# Patient Record
Sex: Male | Born: 1995 | Race: Black or African American | Hispanic: No | Marital: Single | Smoking: Never smoker
Health system: Southern US, Community
[De-identification: ages and names within clinical notes are randomized; demographics above are authoritative.]

## PROBLEM LIST (undated history)

## (undated) DIAGNOSIS — S069X9A Unspecified intracranial injury with loss of consciousness of unspecified duration, initial encounter: Secondary | ICD-10-CM

## (undated) DIAGNOSIS — S069XAA Unspecified intracranial injury with loss of consciousness status unknown, initial encounter: Secondary | ICD-10-CM

---

## 2017-01-31 ENCOUNTER — Encounter (HOSPITAL_COMMUNITY): Payer: Self-pay | Admitting: *Deleted

## 2017-01-31 DIAGNOSIS — Z202 Contact with and (suspected) exposure to infections with a predominantly sexual mode of transmission: Secondary | ICD-10-CM | POA: Insufficient documentation

## 2017-01-31 DIAGNOSIS — F17210 Nicotine dependence, cigarettes, uncomplicated: Secondary | ICD-10-CM | POA: Insufficient documentation

## 2017-01-31 DIAGNOSIS — M545 Low back pain: Secondary | ICD-10-CM | POA: Insufficient documentation

## 2017-01-31 LAB — COMPREHENSIVE METABOLIC PANEL
ALBUMIN: 4.3 g/dL (ref 3.5–5.0)
ALK PHOS: 42 U/L (ref 38–126)
ALT: 11 U/L — AB (ref 17–63)
ANION GAP: 7 (ref 5–15)
AST: 18 U/L (ref 15–41)
BUN: 8 mg/dL (ref 6–20)
CHLORIDE: 104 mmol/L (ref 101–111)
CO2: 27 mmol/L (ref 22–32)
CREATININE: 1.07 mg/dL (ref 0.61–1.24)
Calcium: 9.2 mg/dL (ref 8.9–10.3)
GFR calc Af Amer: >60 mL/min (ref >60)
GFR calc non Af Amer: >60 mL/min (ref >60)
Glucose, Bld: 95 mg/dL (ref 65–99)
Potassium: 4 mmol/L (ref 3.5–5.1)
SODIUM: 138 mmol/L (ref 135–145)
Total Bilirubin: 0.8 mg/dL (ref 0.3–1.2)
Total Protein: 6.8 g/dL (ref 6.5–8.1)

## 2017-01-31 LAB — URINALYSIS, ROUTINE W REFLEX MICROSCOPIC
Bilirubin Urine: NEGATIVE
GLUCOSE, UA: NEGATIVE mg/dL
HGB URINE DIPSTICK: NEGATIVE
KETONES UR: NEGATIVE mg/dL
Leukocytes, UA: NEGATIVE
Nitrite: NEGATIVE
PROTEIN: NEGATIVE mg/dL
Specific Gravity, Urine: 1.023 (ref 1.005–1.030)
pH: 6 (ref 5.0–8.0)

## 2017-01-31 LAB — CBC
HCT: 39.2 % (ref 39.0–52.0)
Hemoglobin: 13.2 g/dL (ref 13.0–17.0)
MCH: 29.2 pg (ref 26.0–34.0)
MCHC: 33.7 g/dL (ref 30.0–36.0)
MCV: 86.7 fL (ref 78.0–100.0)
PLATELETS: 218 10*3/uL (ref 150–400)
RBC: 4.52 MIL/uL (ref 4.22–5.81)
RDW: 11.9 % (ref 11.5–15.5)
WBC: 5.3 10*3/uL (ref 4.0–10.5)

## 2017-01-31 NOTE — ED Triage Notes (Signed)
Pt c/o lower abdominal and back pain x 5 days. Pain is intermittent, denies urinary issues

## 2017-02-01 ENCOUNTER — Emergency Department (HOSPITAL_COMMUNITY)
Admission: EM | Admit: 2017-02-01 | Discharge: 2017-02-01 | Disposition: A | Discharge status: Home / Self Care | Payer: Self-pay | Attending: Emergency Medicine | Admitting: Emergency Medicine

## 2017-02-01 DIAGNOSIS — R1030 Lower abdominal pain, unspecified: Secondary | ICD-10-CM

## 2017-02-01 DIAGNOSIS — Z113 Encounter for screening for infections with a predominantly sexual mode of transmission: Secondary | ICD-10-CM

## 2017-02-01 LAB — RPR: RPR: NONREACTIVE

## 2017-02-01 MED ORDER — AZITHROMYCIN 250 MG PO TABS
1000.0000 mg | ORAL_TABLET | Freq: Once | ORAL | Status: AC
  Administered 2017-02-01: 1000 mg via ORAL
  Filled 2017-02-01: qty 4

## 2017-02-01 MED ORDER — CEFTRIAXONE SODIUM 250 MG IJ SOLR
250.0000 mg | Freq: Once | INTRAMUSCULAR | Status: AC
  Administered 2017-02-01: 250 mg via INTRAMUSCULAR
  Filled 2017-02-01: qty 250

## 2017-02-01 MED ORDER — STERILE WATER FOR INJECTION IJ SOLN
INTRAMUSCULAR | Status: AC
  Administered 2017-02-01: 1 mL
  Filled 2017-02-01: qty 10

## 2017-02-01 NOTE — Discharge Instructions (Signed)
°  Use a condom with every sexual encounter Follow up with your doctor in regards to today's visit.   Please return to the ER for worsening symptoms, high fevers or persistent vomiting.  You have been tested for HIV, syphilis, chlamydia and gonorrhea. These results will be available in approximately 3 days. You will be notified if they are positive.

## 2017-02-01 NOTE — ED Provider Notes (Signed)
[facility]-EMERGENCY DEPT Provider Note   CSN: [id number] Arrival date & time: 01/31/17  2229     History   Chief Complaint Chief Complaint  Patient presents with  . Abdominal Pain  . Back Pain    HPI Brett Moreno is a 21 y.o. male.  The history is provided by the patient and medical records. No language interpreter was used.  Abdominal Pain   Pertinent negatives include diarrhea, nausea, vomiting and constipation.  Back Pain   Associated symptoms include abdominal pain.   Brett Moreno is an otherwise healthy 21 y.o. male  who presents to the Emergency Department for STD check. Patient states that his significant other has tested positive for an infection, therefore he came to the emergency department to make sure that he also did not have this infection.Over the last 5 days he has had intermittent lower abdominal pain and low back cramping. No nausea, vomiting, penile discharge, rashes/lesions, fever, chills, chest pain, shortness of breath and no medications taken prior to arrival for his symptoms. Asymptomatic at present. He states that the symptoms will come and go throughout the last several days, never lasting more than 20-30 minutes.   History reviewed. No pertinent past medical history.  There are no active problems to display for this patient.   History reviewed. No pertinent surgical history.     Home Medications    Prior to Admission medications   Not on File    Family History No family history on file.  Social History Social History  Substance Use Topics  . Smoking status: Current Every Day Smoker  . Smokeless tobacco: Never Used  . Alcohol use Yes     Allergies   Patient has no known allergies.   Review of Systems Review of Systems  Gastrointestinal: Positive for abdominal pain. Negative for blood in stool, constipation, diarrhea, nausea and vomiting.  Musculoskeletal: Positive for back pain.  All other systems reviewed and are  negative.    Physical Exam Updated Vital Signs BP 114/64 (BP Location: Right Arm)   Pulse 61   Temp 97.8 F (36.6 C) (Oral)   Resp 16   SpO2 99%   Physical Exam  Constitutional: He is oriented to person, place, and time. He appears well-developed and well-nourished. No distress.  HENT:  Head: Normocephalic and atraumatic.  Cardiovascular: Normal rate, regular rhythm and normal heart sounds.   No murmur heard. Pulmonary/Chest: Effort normal and breath sounds normal. No respiratory distress.  Abdominal: Soft. He exhibits no distension. There is no tenderness.  No abdominal, flank or CVA tenderness.  Musculoskeletal: Normal range of motion.  No midline tenderness. Full ROM and 5/5 strength throughout.  Neurological: He is alert and oriented to person, place, and time.  Bilateral lower extremities neurovascularly intact.   Skin: Skin is warm and dry.  Nursing note and vitals reviewed.    ED Treatments / Results  Labs (all labs ordered are listed, but only abnormal results are displayed) Labs Reviewed  COMPREHENSIVE METABOLIC PANEL - Abnormal; Notable for the following:       Result Value   ALT 11 (*)    All other components within normal limits  CBC  URINALYSIS, ROUTINE W REFLEX MICROSCOPIC  RPR  HIV ANTIBODY (ROUTINE TESTING)  GC/CHLAMYDIA PROBE AMP ([place]) NOT AT [facility]    EKG  EKG Interpretation None       Radiology No results found.  Procedures Procedures (including critical care time)  Medications Ordered in ED Medications  cefTRIAXone (ROCEPHIN)  injection 250 mg (not administered)  azithromycin (ZITHROMAX) tablet 1,000 mg (not administered)     Initial Impression / Assessment and Plan / ED Course  I have reviewed the triage vital signs and the nursing notes.  Pertinent labs & imaging results that were available during my care of the patient were reviewed by me and considered in my medical decision making (see chart for details).    Brett Moreno is a 21 y.o. male who presents to ED requesting STD check. Has had intermittent lower abdominal pain and low back pain which is not present tonight. Benign abdominal exam. No midline tenderness or CVA tenderness. Full strength with no neuro deficits. No red flag back pain symptoms. Lab work and urine reassuring. HIV, RPR, GC obtained and patient aware that he will be notified if results are positive. Prophylactic Rocephin and Zithromax given. Evaluation does not show pathology that would require ongoing emergent intervention or inpatient treatment. Reasons to return to ER discussed and all questions answered.    Final Clinical Impressions(s) / ED Diagnoses   Final diagnoses:  Screen for STD (sexually transmitted disease)  Lower abdominal pain    New Prescriptions New Prescriptions   No medications on file     Ward, Chase Picket, PA-C 02/01/17 0350    Melene Plan, DO 02/01/17 0405

## 2017-02-02 LAB — GC/CHLAMYDIA PROBE AMP (~~LOC~~) NOT AT ARMC
CHLAMYDIA, DNA PROBE: NEGATIVE
NEISSERIA GONORRHEA: NEGATIVE

## 2017-02-11 LAB — HIV 1/2 AB DIFFERENTIATION
HIV 1 AB: NEGATIVE
HIV 2 AB: NEGATIVE
NOTE (HIV CONF MULTISPOT): NEGATIVE

## 2017-02-11 LAB — HIV ANTIBODY (ROUTINE TESTING W REFLEX): HIV Screen 4th Generation wRfx: REACTIVE — AB

## 2017-02-11 LAB — RNA QUALITATIVE: HIV 1 RNA Qualitative: UNDETERMINED

## 2017-05-13 ENCOUNTER — Other Ambulatory Visit: Payer: Self-pay

## 2017-05-13 ENCOUNTER — Emergency Department (HOSPITAL_COMMUNITY)
Admission: EM | Admit: 2017-05-13 | Discharge: 2017-05-13 | Disposition: A | Discharge status: Home / Self Care | Payer: Self-pay | Attending: Emergency Medicine | Admitting: Emergency Medicine

## 2017-05-13 ENCOUNTER — Encounter (HOSPITAL_COMMUNITY): Payer: Self-pay | Admitting: Emergency Medicine

## 2017-05-13 DIAGNOSIS — G894 Chronic pain syndrome: Secondary | ICD-10-CM | POA: Insufficient documentation

## 2017-05-13 DIAGNOSIS — F17210 Nicotine dependence, cigarettes, uncomplicated: Secondary | ICD-10-CM | POA: Insufficient documentation

## 2017-05-13 NOTE — ED Notes (Signed)
Patient refused to wait for discharge papers, vs, and to sign for discharge. Pt ambulatory without difficulty upon leaving.
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
## 2017-07-17 ENCOUNTER — Emergency Department (HOSPITAL_COMMUNITY)
Admission: EM | Admit: 2017-07-17 | Discharge: 2017-07-17 | Disposition: A | Discharge status: Home / Self Care | Payer: Self-pay | Attending: Emergency Medicine | Admitting: Emergency Medicine

## 2017-07-17 ENCOUNTER — Other Ambulatory Visit: Payer: Self-pay

## 2017-07-17 ENCOUNTER — Emergency Department (HOSPITAL_COMMUNITY): Payer: Self-pay

## 2017-07-17 ENCOUNTER — Encounter (HOSPITAL_COMMUNITY): Payer: Self-pay | Admitting: *Deleted

## 2017-07-17 DIAGNOSIS — S80812A Abrasion, left lower leg, initial encounter: Secondary | ICD-10-CM

## 2017-07-17 DIAGNOSIS — Y999 Unspecified external cause status: Secondary | ICD-10-CM | POA: Insufficient documentation

## 2017-07-17 DIAGNOSIS — Y9389 Activity, other specified: Secondary | ICD-10-CM | POA: Insufficient documentation

## 2017-07-17 DIAGNOSIS — S8012XA Contusion of left lower leg, initial encounter: Secondary | ICD-10-CM

## 2017-07-17 DIAGNOSIS — F17200 Nicotine dependence, unspecified, uncomplicated: Secondary | ICD-10-CM | POA: Insufficient documentation

## 2017-07-17 DIAGNOSIS — Y92410 Unspecified street and highway as the place of occurrence of the external cause: Secondary | ICD-10-CM | POA: Insufficient documentation
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
## 2017-07-17 NOTE — Discharge Instructions (Signed)
X-rays are negative.  Pain is likely from a contusion or your calf and leg. Take tylenol and ibuprofen for pain. Ice. Elevate. Return to the emergency department if your calf feels tight, hard, or changes colors, you have numbness or tingling to your leg.
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
## 2017-07-23 ENCOUNTER — Encounter (HOSPITAL_COMMUNITY): Payer: Self-pay | Admitting: Emergency Medicine

## 2017-07-23 ENCOUNTER — Other Ambulatory Visit: Payer: Self-pay

## 2017-07-23 ENCOUNTER — Emergency Department (HOSPITAL_COMMUNITY): Payer: Self-pay

## 2017-07-23 ENCOUNTER — Emergency Department (HOSPITAL_COMMUNITY)
Admission: EM | Admit: 2017-07-23 | Discharge: 2017-07-24 | Disposition: A | Discharge status: Home / Self Care | Payer: Self-pay | Attending: Emergency Medicine | Admitting: Emergency Medicine

## 2017-07-23 DIAGNOSIS — M79604 Pain in right leg: Secondary | ICD-10-CM

## 2017-07-23 DIAGNOSIS — F17200 Nicotine dependence, unspecified, uncomplicated: Secondary | ICD-10-CM | POA: Insufficient documentation

## 2017-07-23 DIAGNOSIS — Z202 Contact with and (suspected) exposure to infections with a predominantly sexual mode of transmission: Secondary | ICD-10-CM

## 2017-07-23 DIAGNOSIS — M79605 Pain in left leg: Secondary | ICD-10-CM | POA: Insufficient documentation

## 2017-07-23 MED ORDER — ACETAMINOPHEN 325 MG PO TABS
650.0000 mg | ORAL_TABLET | Freq: Once | ORAL | Status: AC
  Administered 2017-07-23: 650 mg via ORAL
  Filled 2017-07-23: qty 2
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
## 2017-07-23 NOTE — Discharge Instructions (Signed)
You can take Tylenol or Ibuprofen as directed for pain. You can alternate Tylenol and Ibuprofen every 4 hours. If you take Tylenol at 1pm, then you can take Ibuprofen at 5pm. Then you can take Tylenol again at 9pm.   Follow the RICE (Rest, Ice, Compression, Elevation) protocol as directed.   As we discussed, you can follow-up with the health department regarding an STD check.  Return emergency department for swelling, redness, warmth of your legs, worsening pain, inability to walk or any other worsening or concerning symptoms.
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
## 2017-08-11 ENCOUNTER — Other Ambulatory Visit: Payer: Self-pay

## 2017-08-11 ENCOUNTER — Emergency Department (HOSPITAL_COMMUNITY)
Admission: EM | Admit: 2017-08-11 | Discharge: 2017-08-11 | Disposition: A | Discharge status: Home / Self Care | Payer: Self-pay | Attending: Physician Assistant | Admitting: Physician Assistant

## 2017-08-11 ENCOUNTER — Encounter (HOSPITAL_COMMUNITY): Payer: Self-pay | Admitting: Emergency Medicine

## 2017-08-11 DIAGNOSIS — Z202 Contact with and (suspected) exposure to infections with a predominantly sexual mode of transmission: Secondary | ICD-10-CM | POA: Insufficient documentation

## 2017-08-11 DIAGNOSIS — R300 Dysuria: Secondary | ICD-10-CM | POA: Insufficient documentation

## 2017-08-11 DIAGNOSIS — F17200 Nicotine dependence, unspecified, uncomplicated: Secondary | ICD-10-CM | POA: Insufficient documentation

## 2017-08-11 LAB — URINALYSIS, ROUTINE W REFLEX MICROSCOPIC
Bilirubin Urine: NEGATIVE
Glucose, UA: NEGATIVE mg/dL
Hgb urine dipstick: NEGATIVE
Ketones, ur: NEGATIVE mg/dL
LEUKOCYTES UA: NEGATIVE
NITRITE: NEGATIVE
PH: 7 (ref 5.0–8.0)
Protein, ur: NEGATIVE mg/dL
SPECIFIC GRAVITY, URINE: 1.025 (ref 1.005–1.030)

## 2017-08-11 MED ORDER — LIDOCAINE HCL (PF) 1 % IJ SOLN
INTRAMUSCULAR | Status: AC
  Administered 2017-08-11: 5 mL
  Filled 2017-08-11: qty 5

## 2017-08-11 MED ORDER — CEFTRIAXONE SODIUM 250 MG IJ SOLR
250.0000 mg | Freq: Once | INTRAMUSCULAR | Status: AC
  Administered 2017-08-11: 250 mg via INTRAMUSCULAR
  Filled 2017-08-11: qty 250

## 2017-08-11 MED ORDER — AZITHROMYCIN 250 MG PO TABS
1000.0000 mg | ORAL_TABLET | Freq: Once | ORAL | Status: AC
  Administered 2017-08-11: 1000 mg via ORAL
  Filled 2017-08-11: qty 4

## 2017-08-11 NOTE — ED Triage Notes (Signed)
Reports he needs STD screening for "recreational purposes" Denies symptoms at this time.

## 2017-08-11 NOTE — ED Triage Notes (Signed)
Pt now reporting scrotal pain.

## 2017-08-11 NOTE — ED Provider Notes (Signed)
[facility]   CSN: [id number] Arrival date & time: 08/11/17  1134     History   Chief Complaint No chief complaint on file.   HPI  Brett Moreno is a 22 y.o. Male, presents to the ED for evaluation of dysuria.  Patient reports he intermittently has discomfort when urinating for the past few days, patient denies any penile discharge, he reports intermittently having some scrotal discomfort while urinating which always resolves after urination.  Patient denies any testicular pain or swelling.  Patient denies any genital bumps or lesions.  No abdominal pain no rectal pain or pain with defecation.  No fevers or chills no nausea, vomiting or diarrhea.  Patient is unsure if he is been exposed to any STDs, was seen prior and was asymptomatic was told to follow-up with the health department but has not done this.      History reviewed. No pertinent past medical history.  There are no active problems to display for this patient.   History reviewed. No pertinent surgical history.     Home Medications    Prior to Admission medications   Not on File    Family History History reviewed. No pertinent family history.  Social History Social History   Tobacco Use  . Smoking status: Current Every Day Smoker  . Smokeless tobacco: Never Used  Substance Use Topics  . Alcohol use: Yes  . Drug use: No     Allergies   Patient has no known allergies.   Review of Systems Review of Systems  Constitutional: Negative for chills and fever.  Gastrointestinal: Negative for abdominal pain, nausea and vomiting.  Genitourinary: Positive for dysuria. Negative for discharge, flank pain, frequency, genital sores, hematuria, penile pain, penile swelling, scrotal swelling and testicular pain.  Skin: Negative for rash.     Physical Exam Updated Vital Signs BP 121/63 (BP Location: Left Arm)   Pulse 75   Temp 98.1 F (36.7 C) (Oral)    Resp 16   SpO2 100%   Physical Exam  Constitutional: He appears well-developed and well-nourished. No distress.  HENT:  Head: Normocephalic and atraumatic.  Eyes: Right eye exhibits no discharge. Left eye exhibits no discharge.  Pulmonary/Chest: Effort normal. No respiratory distress.  Abdominal: Soft. Bowel sounds are normal. He exhibits no distension and no mass. There is no tenderness. There is no guarding.  Genitourinary:  Genitourinary Comments: Chaperone present during genital exam No external lesions present No obvious discharge present or evidence of urethritis No scrotal erythema, swelling or tenderness, no testicular tenderness  Neurological: He is alert. Coordination normal.  Skin: Skin is warm and dry. Capillary refill takes less than 2 seconds. He is not diaphoretic.  Psychiatric: He has a normal mood and affect. His behavior is normal.  Nursing note and vitals reviewed.    ED Treatments / Results  Labs (all labs ordered are listed, but only abnormal results are displayed) Labs Reviewed  URINALYSIS, ROUTINE W REFLEX MICROSCOPIC - Abnormal; Notable for the following components:      Result Value   APPearance CLOUDY (*)    All other components within normal limits  HIV ANTIBODY (ROUTINE TESTING)  RPR  GC[facility]CHLAMYDIA [facility] AMP [facility]    EKG  EKG Interpretation None       Radiology No results found.  Procedures Procedures (including critical care time)  Medications Ordered in ED Medications  cefTRIAXone (ROCEPHIN) injection 250 mg (not administered)  azithromycin (ZITHROMAX)  tablet 1,000 mg (not administered)  lidocaine (PF) (XYLOCAINE) 1 % injection (not administered)     Initial Impression / Assessment and Plan / ED Course  I have reviewed the triage vital signs and the nursing notes.  Pertinent labs & imaging results that were available during my care of the patient were reviewed by me and considered in my medical decision making  (see chart for details).  Patient is afebrile without abdominal tenderness, abdominal pain or painful bowel movements to indicate prostatitis.  No tenderness to palpation of the testes or epididymis to suggest orchitis or epididymitis.  STD cultures obtained including HIV, syphilis, gonorrhea and chlamydia.  UA shows no evidence of trichomonas, UA without signs of infection. Patient to be discharged with instructions to follow up with PCP. Discussed importance of using protection when sexually active. Pt understands that they have GC/Chlamydia cultures pending and that they will need to inform all sexual partners if results return positive. Patient has been treated prophylactically with azithromycin and Rocephin.    Final Clinical Impressions(s) / ED Diagnoses   Final diagnoses:  Dysuria  Possible exposure to STD    ED Discharge Orders    None       Legrand Rams 08/11/17 1535    Abelino Derrick, MD 08/14/17 2008

## 2017-08-11 NOTE — Discharge Instructions (Signed)
You were prophylactically treated for gonorrhea and chlamydia today, you have STD testing pending and will be called in 2-3 days if any of these results are positive.  Please ensure you are using condoms and notify any partners of any positive results.  In the future please follow-up at the health department for STD testing.

## 2017-08-11 NOTE — ED Notes (Signed)
Pt understands D/C instructions.

## 2017-08-12 LAB — RPR: RPR: NONREACTIVE

## 2017-08-12 LAB — GC/CHLAMYDIA PROBE AMP (~~LOC~~) NOT AT ARMC
CHLAMYDIA, DNA PROBE: NEGATIVE
Neisseria Gonorrhea: NEGATIVE

## 2017-08-12 LAB — HIV ANTIBODY (ROUTINE TESTING W REFLEX): HIV SCREEN 4TH GENERATION: NONREACTIVE

## 2017-09-10 ENCOUNTER — Other Ambulatory Visit: Payer: Self-pay

## 2017-09-10 DIAGNOSIS — R51 Headache: Secondary | ICD-10-CM | POA: Diagnosis present

## 2017-09-10 DIAGNOSIS — Y998 Other external cause status: Secondary | ICD-10-CM | POA: Diagnosis not present

## 2017-09-10 DIAGNOSIS — Y92410 Unspecified street and highway as the place of occurrence of the external cause: Secondary | ICD-10-CM | POA: Diagnosis not present

## 2017-09-10 DIAGNOSIS — S0240DA Maxillary fracture, left side, initial encounter for closed fracture: Secondary | ICD-10-CM | POA: Insufficient documentation

## 2017-09-10 DIAGNOSIS — S02402A Zygomatic fracture, unspecified, initial encounter for closed fracture: Secondary | ICD-10-CM | POA: Insufficient documentation

## 2017-09-10 DIAGNOSIS — K7689 Other specified diseases of liver: Secondary | ICD-10-CM | POA: Diagnosis not present

## 2017-09-10 DIAGNOSIS — Y9355 Activity, bike riding: Secondary | ICD-10-CM | POA: Insufficient documentation

## 2017-09-10 DIAGNOSIS — S0232XA Fracture of orbital floor, left side, initial encounter for closed fracture: Secondary | ICD-10-CM | POA: Diagnosis not present

## 2017-09-10 DIAGNOSIS — F17200 Nicotine dependence, unspecified, uncomplicated: Secondary | ICD-10-CM | POA: Diagnosis not present

## 2017-09-10 DIAGNOSIS — R079 Chest pain, unspecified: Secondary | ICD-10-CM | POA: Insufficient documentation

## 2017-09-10 DIAGNOSIS — M549 Dorsalgia, unspecified: Secondary | ICD-10-CM | POA: Diagnosis not present

## 2017-09-10 DIAGNOSIS — Z23 Encounter for immunization: Secondary | ICD-10-CM | POA: Insufficient documentation
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
## 2017-09-11 ENCOUNTER — Emergency Department (HOSPITAL_COMMUNITY): Payer: No Typology Code available for payment source

## 2017-09-11 ENCOUNTER — Emergency Department (HOSPITAL_COMMUNITY)
Admission: EM | Admit: 2017-09-11 | Discharge: 2017-09-11 | Disposition: A | Discharge status: Home / Self Care | Payer: No Typology Code available for payment source | Attending: Emergency Medicine | Admitting: Emergency Medicine

## 2017-09-11 DIAGNOSIS — S0292XA Unspecified fracture of facial bones, initial encounter for closed fracture: Secondary | ICD-10-CM

## 2017-09-11 DIAGNOSIS — S0285XA Fracture of orbit, unspecified, initial encounter for closed fracture: Secondary | ICD-10-CM

## 2017-09-11 DIAGNOSIS — T07XXXA Unspecified multiple injuries, initial encounter: Secondary | ICD-10-CM

## 2017-09-11 LAB — COMPREHENSIVE METABOLIC PANEL
ALK PHOS: 47 U/L (ref 38–126)
ALT: 18 U/L (ref 17–63)
AST: 25 U/L (ref 15–41)
Albumin: 4.7 g/dL (ref 3.5–5.0)
Anion gap: 13 (ref 5–15)
BUN: 13 mg/dL (ref 6–20)
CO2: 19 mmol/L — ABNORMAL LOW (ref 22–32)
CREATININE: 1.01 mg/dL (ref 0.61–1.24)
Calcium: 9.3 mg/dL (ref 8.9–10.3)
Chloride: 102 mmol/L (ref 101–111)
GFR calc Af Amer: >60 mL/min (ref >60)
Glucose, Bld: 90 mg/dL (ref 65–99)
Potassium: 4 mmol/L (ref 3.5–5.1)
Sodium: 134 mmol/L — ABNORMAL LOW (ref 135–145)
Total Bilirubin: 1.2 mg/dL (ref 0.3–1.2)
Total Protein: 7.2 g/dL (ref 6.5–8.1)

## 2017-09-11 LAB — ETHANOL: Alcohol, Ethyl (B): 54 mg/dL — ABNORMAL HIGH (ref <10)

## 2017-09-11 LAB — SAMPLE TO BLOOD BANK

## 2017-09-11 LAB — CBC
HCT: 41.7 % (ref 39.0–52.0)
Hemoglobin: 14.2 g/dL (ref 13.0–17.0)
MCH: 30.2 pg (ref 26.0–34.0)
MCHC: 34.1 g/dL (ref 30.0–36.0)
MCV: 88.7 fL (ref 78.0–100.0)
PLATELETS: 244 10*3/uL (ref 150–400)
RBC: 4.7 MIL/uL (ref 4.22–5.81)
RDW: 11.8 % (ref 11.5–15.5)
WBC: 19.6 10*3/uL — AB (ref 4.0–10.5)

## 2017-09-11 LAB — I-STAT CG4 LACTIC ACID, ED: Lactic Acid, Venous: 2.96 mmol/L (ref 0.5–1.9)

## 2017-09-11 LAB — I-STAT CHEM 8, ED
BUN: 14 mg/dL (ref 6–20)
CHLORIDE: 104 mmol/L (ref 101–111)
CREATININE: 1 mg/dL (ref 0.61–1.24)
Calcium, Ion: 1.08 mmol/L — ABNORMAL LOW (ref 1.15–1.40)
Glucose, Bld: 84 mg/dL (ref 65–99)
HEMATOCRIT: 45 % (ref 39.0–52.0)
HEMOGLOBIN: 15.3 g/dL (ref 13.0–17.0)
POTASSIUM: 4.1 mmol/L (ref 3.5–5.1)
Sodium: 138 mmol/L (ref 135–145)
TCO2: 22 mmol/L (ref 22–32)

## 2017-09-11 MED ORDER — BACITRACIN ZINC 500 UNIT/GM EX OINT: 1.0000 "application " | TOPICAL_OINTMENT | Freq: Two times a day (BID) | CUTANEOUS | 0 refills | Status: DC

## 2017-09-11 MED ORDER — CEFAZOLIN SODIUM-DEXTROSE 1-4 GM/50ML-% IV SOLN
1.0000 g | Freq: Once | INTRAVENOUS | Status: AC
  Administered 2017-09-11: 1 g via INTRAVENOUS
  Filled 2017-09-11 (×2): qty 50

## 2017-09-11 MED ORDER — TETANUS-DIPHTH-ACELL PERTUSSIS 5-2.5-18.5 LF-MCG/0.5 IM SUSP
0.5000 mL | Freq: Once | INTRAMUSCULAR | Status: AC
  Administered 2017-09-11: 0.5 mL via INTRAMUSCULAR
  Filled 2017-09-11: qty 0.5

## 2017-09-11 MED ORDER — BACITRACIN ZINC 500 UNIT/GM EX OINT
TOPICAL_OINTMENT | Freq: Two times a day (BID) | CUTANEOUS | Status: DC
  Filled 2017-09-11: qty 0.9

## 2017-09-11 MED ORDER — TETANUS-DIPHTH-ACELL PERTUSSIS 5-2.5-18.5 LF-MCG/0.5 IM SUSP: 0.5000 mL | Freq: Once | INTRAMUSCULAR | Status: DC

## 2017-09-11 MED ORDER — NAPROXEN 500 MG PO TABS: 500.0000 mg | ORAL_TABLET | Freq: Two times a day (BID) | ORAL | 0 refills | Status: DC

## 2017-09-11 MED ORDER — IOPAMIDOL (ISOVUE-300) INJECTION 61%
INTRAVENOUS | Status: AC
  Administered 2017-09-11: 100 mL
  Filled 2017-09-11: qty 100

## 2017-09-11 MED ORDER — HYDROCODONE-ACETAMINOPHEN 5-325 MG PO TABS: 1.0000 | ORAL_TABLET | Freq: Four times a day (QID) | ORAL | 0 refills | Status: DC | PRN

## 2017-09-11 MED ORDER — MORPHINE SULFATE (PF) 4 MG/ML IV SOLN
6.0000 mg | INTRAVENOUS | Status: DC | PRN
  Administered 2017-09-11: 6 mg via INTRAVENOUS
  Filled 2017-09-11: qty 2
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
## 2017-09-11 NOTE — ED Notes (Addendum)
Attempted to help Pt into gown, Pt states " I can get it on on my own".

## 2017-09-11 NOTE — ED Notes (Signed)
Pt on c- collar.Quarter size Abrasions on right upper lip, right hand and left palm. Bleeding controlled. Pupils equal and reactive to light. Pt complaint of pain to neck and face.

## 2017-09-11 NOTE — ED Notes (Addendum)
MD at bedside reviewing imaging results. C-collar removed by MD.
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
## 2018-08-15 ENCOUNTER — Other Ambulatory Visit: Payer: Self-pay

## 2018-08-15 ENCOUNTER — Emergency Department (HOSPITAL_COMMUNITY)
Admission: EM | Admit: 2018-08-15 | Discharge: 2018-08-15 | Disposition: A | Discharge status: Home / Self Care | Payer: Self-pay | Attending: Emergency Medicine | Admitting: Emergency Medicine

## 2018-08-15 ENCOUNTER — Encounter (HOSPITAL_COMMUNITY): Payer: Self-pay | Admitting: Emergency Medicine

## 2018-08-15 DIAGNOSIS — F17200 Nicotine dependence, unspecified, uncomplicated: Secondary | ICD-10-CM | POA: Insufficient documentation

## 2018-08-15 DIAGNOSIS — Z79899 Other long term (current) drug therapy: Secondary | ICD-10-CM | POA: Insufficient documentation

## 2018-08-15 DIAGNOSIS — Z202 Contact with and (suspected) exposure to infections with a predominantly sexual mode of transmission: Secondary | ICD-10-CM | POA: Insufficient documentation

## 2018-08-15 DIAGNOSIS — R21 Rash and other nonspecific skin eruption: Secondary | ICD-10-CM | POA: Insufficient documentation

## 2018-08-15 DIAGNOSIS — J069 Acute upper respiratory infection, unspecified: Secondary | ICD-10-CM | POA: Insufficient documentation

## 2018-08-15 DIAGNOSIS — Z711 Person with feared health complaint in whom no diagnosis is made: Secondary | ICD-10-CM

## 2018-08-15 LAB — URINALYSIS, ROUTINE W REFLEX MICROSCOPIC
Bilirubin Urine: NEGATIVE
Glucose, UA: NEGATIVE mg/dL
Hgb urine dipstick: NEGATIVE
Ketones, ur: 5 mg/dL — AB
Leukocytes,Ua: NEGATIVE
Nitrite: NEGATIVE
PROTEIN: NEGATIVE mg/dL
Specific Gravity, Urine: 1.018 (ref 1.005–1.030)
pH: 6 (ref 5.0–8.0)

## 2018-08-15 NOTE — ED Provider Notes (Signed)
[facility] EMERGENCY DEPARTMENT Provider Note   CSN: [id number] Arrival date & time: 08/15/18  1345    History   Chief Complaint Chief Complaint  Patient presents with  . Exposure to STD    HPI Brett Moreno is a 23 y.o. male who presents with URI symptoms and request for STD check.  The patient states that recently he has had chills and nasal congestion.  He states he is sexually active and last time was about a month ago.  He has been having some suprapubic pressure when he pees and is unsure if he has an STD or not.  He denies fever, runny nose, sore throat, chest pain, shortness of breath, headache, coughing, abdominal pain, nausea, vomiting, diarrhea, dysuria, penile discharge, testicular pain.  He has had several bumps on his finger, chin and genital area. The area is not itchy or painful.     HPI  History reviewed. No pertinent past medical history.  There are no active problems to display for this patient.   History reviewed. No pertinent surgical history.      Home Medications    Prior to Admission medications   Medication Sig Start Date End Date Taking? Authorizing Provider  bacitracin ointment Apply 1 application topically 2 (two) times daily. 09/11/17   Derwood Kaplan, MD  HYDROcodone-acetaminophen (NORCO/VICODIN) 5-325 MG tablet Take 1 tablet by mouth every 6 (six) hours as needed. 09/11/17   Derwood Kaplan, MD  naproxen (NAPROSYN) 500 MG tablet Take 1 tablet (500 mg total) by mouth 2 (two) times daily. 09/11/17   Derwood Kaplan, MD    Family History No family history on file.  Social History Social History   Tobacco Use  . Smoking status: Current Every Day Smoker  . Smokeless tobacco: Never Used  Substance Use Topics  . Alcohol use: Yes  . Drug use: Yes    Types: Marijuana     Allergies   Patient has no known allergies.   Review of Systems Review of Systems  Constitutional: Positive for chills. Negative for fever.    HENT: Positive for congestion.   Genitourinary: Negative for difficulty urinating, discharge, dysuria, flank pain, penile pain, penile swelling, scrotal swelling and testicular pain.  Skin: Positive for rash.     Physical Exam Updated Vital Signs BP 138/76 (BP Location: Right Arm)   Pulse 97   Temp 98.7 F (37.1 C) (Oral)   Resp 16   SpO2 98%   Physical Exam Vitals signs and nursing note reviewed.  Constitutional:      General: He is not in acute distress.    Appearance: Normal appearance. He is well-developed.     Comments: Calm and cooperative  HENT:     Head: Normocephalic and atraumatic.     Right Ear: Tympanic membrane normal.     Left Ear: Tympanic membrane normal.     Nose: Mucosal edema present.     Mouth/Throat:     Lips: Pink.     Mouth: Mucous membranes are moist.     Pharynx: Oropharynx is clear.  Eyes:     General: No scleral icterus.       Right eye: No discharge.        Left eye: No discharge.     Conjunctiva/sclera: Conjunctivae normal.     Pupils: Pupils are equal, round, and reactive to light.  Neck:     Musculoskeletal: Normal range of motion.  Cardiovascular:     Rate and Rhythm: Normal rate and  regular rhythm.  Pulmonary:     Effort: Pulmonary effort is normal. No respiratory distress.     Breath sounds: Normal breath sounds.  Abdominal:     General: There is no distension.     Palpations: Abdomen is soft.  Genitourinary:    Comments: No inguinal lymphadenopathy or inguinal hernia noted. Normal circumcised penis free of lesions or rash. Ingrown hair over pubic mons. Testicles are nontender with normal lie. Normal scrotal appearance. No obvious discharge noted. Chaperone present during exam.  Skin:    General: Skin is warm and dry.  Neurological:     Mental Status: He is alert and oriented to person, place, and time.  Psychiatric:        Behavior: Behavior normal.      ED Treatments / Results  Labs (all labs ordered are listed, but  only abnormal results are displayed) Labs Reviewed  URINALYSIS, ROUTINE W REFLEX MICROSCOPIC - Abnormal; Notable for the following components:      Result Value   Ketones, ur 5 (*)    All other components within normal limits  GC/CHLAMYDIA PROBE AMP ([place]) NOT AT [facility]    EKG None  Radiology No results found.  Procedures Procedures (including critical care time)  Medications Ordered in ED Medications - No data to display   Initial Impression / Assessment and Plan / ED Course  I have reviewed the triage vital signs and the nursing notes.  Pertinent labs & imaging results that were available during my care of the patient were reviewed by me and considered in my medical decision making (see chart for details).  24 year old male presents with STD check and cold symptoms. Vitals are normal. Exam is unremarkable. UA is essentially normal. G&C is sent. He was told to go to the health dept for future std check  Final Clinical Impressions(s) / ED Diagnoses   Final diagnoses:  Concern about STD in male without diagnosis  Upper respiratory tract infection, unspecified type    ED Discharge Orders    None       Bethel Born, PA-C 08/16/18 2263    Geoffery Lyons, MD 08/16/18 2329

## 2018-08-15 NOTE — Discharge Instructions (Signed)
Drink plenty of fluids Take Tylenol or Ibuprofen for pain or fever

## 2018-08-15 NOTE — ED Triage Notes (Addendum)
States is here for his monthly STD check states  Has  Had had dark urine , feels pressure  Denies d/c from penis had chills last night but they went away, feels pressure when  He pees

## 2018-08-16 LAB — GC/CHLAMYDIA PROBE AMP (~~LOC~~) NOT AT ARMC
Chlamydia: NEGATIVE
Neisseria Gonorrhea: NEGATIVE

## 2018-10-28 ENCOUNTER — Emergency Department (HOSPITAL_COMMUNITY)
Admission: EM | Admit: 2018-10-28 | Discharge: 2018-10-29 | Payer: Self-pay | Attending: Emergency Medicine | Admitting: Emergency Medicine

## 2018-10-28 ENCOUNTER — Emergency Department (HOSPITAL_COMMUNITY): Payer: Self-pay

## 2018-10-28 ENCOUNTER — Encounter (HOSPITAL_COMMUNITY): Payer: Self-pay | Admitting: *Deleted

## 2018-10-28 ENCOUNTER — Other Ambulatory Visit: Payer: Self-pay

## 2018-10-28 DIAGNOSIS — S80811A Abrasion, right lower leg, initial encounter: Secondary | ICD-10-CM | POA: Insufficient documentation

## 2018-10-28 DIAGNOSIS — S50811A Abrasion of right forearm, initial encounter: Secondary | ICD-10-CM | POA: Insufficient documentation

## 2018-10-28 DIAGNOSIS — T07XXXA Unspecified multiple injuries, initial encounter: Secondary | ICD-10-CM

## 2018-10-28 DIAGNOSIS — R071 Chest pain on breathing: Secondary | ICD-10-CM | POA: Insufficient documentation

## 2018-10-28 DIAGNOSIS — F17200 Nicotine dependence, unspecified, uncomplicated: Secondary | ICD-10-CM | POA: Insufficient documentation

## 2018-10-28 DIAGNOSIS — R1012 Left upper quadrant pain: Secondary | ICD-10-CM | POA: Insufficient documentation

## 2018-10-28 DIAGNOSIS — Y999 Unspecified external cause status: Secondary | ICD-10-CM | POA: Insufficient documentation

## 2018-10-28 DIAGNOSIS — Y929 Unspecified place or not applicable: Secondary | ICD-10-CM | POA: Insufficient documentation

## 2018-10-28 DIAGNOSIS — Y939 Activity, unspecified: Secondary | ICD-10-CM | POA: Insufficient documentation

## 2018-10-28 DIAGNOSIS — R51 Headache: Secondary | ICD-10-CM | POA: Insufficient documentation

## 2018-10-28 DIAGNOSIS — R0789 Other chest pain: Secondary | ICD-10-CM | POA: Insufficient documentation

## 2018-10-28 DIAGNOSIS — M25561 Pain in right knee: Secondary | ICD-10-CM | POA: Insufficient documentation

## 2018-10-28 DIAGNOSIS — M542 Cervicalgia: Secondary | ICD-10-CM | POA: Insufficient documentation

## 2018-10-28 LAB — I-STAT CHEM 8, ED
BUN: 20 mg/dL (ref 6–20)
Calcium, Ion: 1.21 mmol/L (ref 1.15–1.40)
Chloride: 101 mmol/L (ref 98–111)
Creatinine, Ser: 1.2 mg/dL (ref 0.61–1.24)
Glucose, Bld: 87 mg/dL (ref 70–99)
HCT: 48 % (ref 39.0–52.0)
Hemoglobin: 16.3 g/dL (ref 13.0–17.0)
Potassium: 3.8 mmol/L (ref 3.5–5.1)
Sodium: 139 mmol/L (ref 135–145)
TCO2: 30 mmol/L (ref 22–32)

## 2018-10-28 LAB — URINALYSIS, ROUTINE W REFLEX MICROSCOPIC
Bacteria, UA: NONE SEEN
Bilirubin Urine: NEGATIVE
Glucose, UA: NEGATIVE mg/dL
Hgb urine dipstick: NEGATIVE
Ketones, ur: NEGATIVE mg/dL
Leukocytes,Ua: NEGATIVE
Nitrite: NEGATIVE
Protein, ur: 30 mg/dL — AB
Specific Gravity, Urine: 1.034 — ABNORMAL HIGH (ref 1.005–1.030)
pH: 5 (ref 5.0–8.0)

## 2018-10-28 LAB — COMPREHENSIVE METABOLIC PANEL
ALT: 18 U/L (ref 0–44)
AST: 37 U/L (ref 15–41)
Albumin: 4.8 g/dL (ref 3.5–5.0)
Alkaline Phosphatase: 43 U/L (ref 38–126)
Anion gap: 11 (ref 5–15)
BUN: 16 mg/dL (ref 6–20)
CO2: 28 mmol/L (ref 22–32)
Calcium: 10 mg/dL (ref 8.9–10.3)
Chloride: 101 mmol/L (ref 98–111)
Creatinine, Ser: 1.25 mg/dL — ABNORMAL HIGH (ref 0.61–1.24)
GFR calc Af Amer: >60 mL/min (ref >60)
GFR calc non Af Amer: >60 mL/min (ref >60)
Glucose, Bld: 89 mg/dL (ref 70–99)
Potassium: 3.7 mmol/L (ref 3.5–5.1)
Sodium: 140 mmol/L (ref 135–145)
Total Bilirubin: 1.8 mg/dL — ABNORMAL HIGH (ref 0.3–1.2)
Total Protein: 7.7 g/dL (ref 6.5–8.1)

## 2018-10-28 LAB — CBC
HCT: 45.3 % (ref 39.0–52.0)
Hemoglobin: 15.2 g/dL (ref 13.0–17.0)
MCH: 30.3 pg (ref 26.0–34.0)
MCHC: 33.6 g/dL (ref 30.0–36.0)
MCV: 90.2 fL (ref 80.0–100.0)
Platelets: 235 10*3/uL (ref 150–400)
RBC: 5.02 MIL/uL (ref 4.22–5.81)
RDW: 11.7 % (ref 11.5–15.5)
WBC: 10.6 10*3/uL — ABNORMAL HIGH (ref 4.0–10.5)
nRBC: 0 % (ref 0.0–0.2)

## 2018-10-28 LAB — SAMPLE TO BLOOD BANK

## 2018-10-28 LAB — LACTIC ACID, PLASMA: Lactic Acid, Venous: 1.3 mmol/L (ref 0.5–1.9)

## 2018-10-28 LAB — ETHANOL: Alcohol, Ethyl (B): <10 mg/dL (ref <10)

## 2018-10-28 MED ORDER — IOHEXOL 300 MG/ML  SOLN
100.0000 mL | Freq: Once | INTRAMUSCULAR | Status: AC | PRN
  Administered 2018-10-28: 100 mL via INTRAVENOUS

## 2018-10-28 MED ORDER — MORPHINE SULFATE (PF) 4 MG/ML IV SOLN
4.0000 mg | Freq: Once | INTRAVENOUS | Status: AC
  Administered 2018-10-28: 4 mg via INTRAVENOUS
  Filled 2018-10-28: qty 1
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
## 2018-10-28 NOTE — ED Notes (Signed)
Per RN Selena Batten pt in [place] will return for lab draws
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
## 2018-10-28 NOTE — ED Notes (Signed)
Pt in CT at this moment. Will draw Protime when he returns

## 2018-10-29 LAB — PROTIME-INR
INR: 1.2 (ref 0.8–1.2)
Prothrombin Time: 15.3 seconds — ABNORMAL HIGH (ref 11.4–15.2)
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
## 2020-04-16 ENCOUNTER — Emergency Department (HOSPITAL_COMMUNITY)
Admission: EM | Admit: 2020-04-16 | Discharge: 2020-04-16 | Payer: Self-pay | Attending: Emergency Medicine | Admitting: Emergency Medicine

## 2020-04-16 ENCOUNTER — Emergency Department (HOSPITAL_COMMUNITY): Payer: Self-pay

## 2020-04-16 ENCOUNTER — Other Ambulatory Visit: Payer: Self-pay

## 2020-04-16 DIAGNOSIS — S0181XA Laceration without foreign body of other part of head, initial encounter: Secondary | ICD-10-CM | POA: Insufficient documentation

## 2020-04-16 DIAGNOSIS — S46912A Strain of unspecified muscle, fascia and tendon at shoulder and upper arm level, left arm, initial encounter: Secondary | ICD-10-CM | POA: Insufficient documentation

## 2020-04-16 DIAGNOSIS — F17200 Nicotine dependence, unspecified, uncomplicated: Secondary | ICD-10-CM | POA: Insufficient documentation

## 2020-04-16 DIAGNOSIS — M25512 Pain in left shoulder: Secondary | ICD-10-CM

## 2020-04-16 MED ORDER — ACETAMINOPHEN 500 MG PO TABS
1000.0000 mg | ORAL_TABLET | Freq: Once | ORAL | Status: AC
  Administered 2020-04-16: 1000 mg via ORAL
  Filled 2020-04-16: qty 2
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
## 2020-04-16 NOTE — ED Notes (Addendum)
MD at bedside. 

## 2020-04-16 NOTE — Discharge Instructions (Addendum)
Keep wound clean with soap and water. Use tylenol for pain every 4 hrs as needed or motrin every 6 hrs. Gradually increase use of left shoulder, there are no breaks or dislocations.

## 2020-04-16 NOTE — ED Notes (Addendum)
Per mom pt has psychiatric history
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
## 2020-04-16 NOTE — ED Notes (Addendum)
Wounds cleaned, bandaged

## 2020-04-17 ENCOUNTER — Emergency Department (HOSPITAL_COMMUNITY)
Admission: EM | Admit: 2020-04-17 | Discharge: 2020-04-17 | Disposition: A | Discharge status: Home / Self Care | Attending: Emergency Medicine | Admitting: Emergency Medicine

## 2020-04-17 ENCOUNTER — Encounter (HOSPITAL_COMMUNITY): Payer: Self-pay

## 2020-04-17 ENCOUNTER — Other Ambulatory Visit: Payer: Self-pay

## 2020-04-17 DIAGNOSIS — M25512 Pain in left shoulder: Secondary | ICD-10-CM | POA: Diagnosis present

## 2020-04-17 DIAGNOSIS — T1491XA Suicide attempt, initial encounter: Secondary | ICD-10-CM | POA: Diagnosis not present

## 2020-04-17 DIAGNOSIS — R519 Headache, unspecified: Secondary | ICD-10-CM | POA: Diagnosis not present

## 2020-04-17 DIAGNOSIS — F17200 Nicotine dependence, unspecified, uncomplicated: Secondary | ICD-10-CM | POA: Insufficient documentation

## 2020-04-17 DIAGNOSIS — F329 Major depressive disorder, single episode, unspecified: Secondary | ICD-10-CM | POA: Diagnosis not present

## 2020-04-17 LAB — CBC WITH DIFFERENTIAL/PLATELET
Abs Immature Granulocytes: 0.01 10*3/uL (ref 0.00–0.07)
Basophils Absolute: 0 10*3/uL (ref 0.0–0.1)
Basophils Relative: 0 %
Eosinophils Absolute: 0.1 10*3/uL (ref 0.0–0.5)
Eosinophils Relative: 1 %
HCT: 41.2 % (ref 39.0–52.0)
Hemoglobin: 13.8 g/dL (ref 13.0–17.0)
Immature Granulocytes: 0 %
Lymphocytes Relative: 19 %
Lymphs Abs: 1.2 10*3/uL (ref 0.7–4.0)
MCH: 30.5 pg (ref 26.0–34.0)
MCHC: 33.5 g/dL (ref 30.0–36.0)
MCV: 91.2 fL (ref 80.0–100.0)
Monocytes Absolute: 0.5 10*3/uL (ref 0.1–1.0)
Monocytes Relative: 8 %
Neutro Abs: 4.4 10*3/uL (ref 1.7–7.7)
Neutrophils Relative %: 72 %
Platelets: 221 10*3/uL (ref 150–400)
RBC: 4.52 MIL/uL (ref 4.22–5.81)
RDW: 10.8 % — ABNORMAL LOW (ref 11.5–15.5)
WBC: 6.2 10*3/uL (ref 4.0–10.5)
nRBC: 0 % (ref 0.0–0.2)

## 2020-04-17 LAB — COMPREHENSIVE METABOLIC PANEL
ALT: 18 U/L (ref 0–44)
AST: 20 U/L (ref 15–41)
Albumin: 4.3 g/dL (ref 3.5–5.0)
Alkaline Phosphatase: 42 U/L (ref 38–126)
Anion gap: 11 (ref 5–15)
BUN: 10 mg/dL (ref 6–20)
CO2: 26 mmol/L (ref 22–32)
Calcium: 9.5 mg/dL (ref 8.9–10.3)
Chloride: 103 mmol/L (ref 98–111)
Creatinine, Ser: 1.14 mg/dL (ref 0.61–1.24)
GFR, Estimated: >60 mL/min (ref >60)
Glucose, Bld: 89 mg/dL (ref 70–99)
Potassium: 3.8 mmol/L (ref 3.5–5.1)
Sodium: 140 mmol/L (ref 135–145)
Total Bilirubin: 1.1 mg/dL (ref 0.3–1.2)
Total Protein: 7.1 g/dL (ref 6.5–8.1)

## 2020-04-17 LAB — ETHANOL: Alcohol, Ethyl (B): <10 mg/dL (ref <10)
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
## 2020-04-17 NOTE — Discharge Instructions (Addendum)
Follow-up with the medical staff at the jail.  Remain on suicide precautions.  Return to the ED or follow-up with the mental health provider if you are having persistent problems and are released from jail.

## 2020-04-17 NOTE — ED Triage Notes (Signed)
Patient presented to with c/o SI. Patient was found on the floor with blanket around his neck. Patient is c/o headache and left shoulder pain. [facility] was [facility] for headache and shoulder pain.

## 2020-04-17 NOTE — ED Notes (Signed)
Patient is been discharge but still waiting for transport to be sent from jail.

## 2020-10-06 ENCOUNTER — Emergency Department (HOSPITAL_COMMUNITY): Payer: Self-pay

## 2020-10-06 ENCOUNTER — Inpatient Hospital Stay (HOSPITAL_COMMUNITY)
Admission: EM | Admit: 2020-10-06 | Discharge: 2020-10-12 | DRG: 025 | Disposition: A | Discharge status: Home / Self Care | Payer: Self-pay | Attending: General Surgery | Admitting: General Surgery

## 2020-10-06 ENCOUNTER — Other Ambulatory Visit: Payer: Self-pay

## 2020-10-06 ENCOUNTER — Encounter (HOSPITAL_COMMUNITY): Payer: Self-pay

## 2020-10-06 DIAGNOSIS — U071 COVID-19: Secondary | ICD-10-CM | POA: Diagnosis present

## 2020-10-06 DIAGNOSIS — R402122 Coma scale, eyes open, to pain, at arrival to emergency department: Secondary | ICD-10-CM | POA: Diagnosis present

## 2020-10-06 DIAGNOSIS — S066X9A Traumatic subarachnoid hemorrhage with loss of consciousness of unspecified duration, initial encounter: Secondary | ICD-10-CM | POA: Diagnosis present

## 2020-10-06 DIAGNOSIS — S20312A Abrasion of left front wall of thorax, initial encounter: Secondary | ICD-10-CM | POA: Diagnosis present

## 2020-10-06 DIAGNOSIS — S065XAA Traumatic subdural hemorrhage with loss of consciousness status unknown, initial encounter: Secondary | ICD-10-CM | POA: Diagnosis present

## 2020-10-06 DIAGNOSIS — J9601 Acute respiratory failure with hypoxia: Secondary | ICD-10-CM | POA: Diagnosis present

## 2020-10-06 DIAGNOSIS — F17200 Nicotine dependence, unspecified, uncomplicated: Secondary | ICD-10-CM | POA: Diagnosis present

## 2020-10-06 DIAGNOSIS — R402342 Coma scale, best motor response, flexion withdrawal, at arrival to emergency department: Secondary | ICD-10-CM | POA: Diagnosis present

## 2020-10-06 DIAGNOSIS — S065X9A Traumatic subdural hemorrhage with loss of consciousness of unspecified duration, initial encounter: Principal | ICD-10-CM | POA: Diagnosis present

## 2020-10-06 DIAGNOSIS — F840 Autistic disorder: Secondary | ICD-10-CM | POA: Diagnosis present

## 2020-10-06 DIAGNOSIS — R402222 Coma scale, best verbal response, incomprehensible words, at arrival to emergency department: Secondary | ICD-10-CM | POA: Diagnosis present

## 2020-10-06 DIAGNOSIS — Y92524 Gas station as the place of occurrence of the external cause: Secondary | ICD-10-CM

## 2020-10-06 DIAGNOSIS — J9690 Respiratory failure, unspecified, unspecified whether with hypoxia or hypercapnia: Secondary | ICD-10-CM

## 2020-10-06 LAB — SAMPLE TO BLOOD BANK

## 2020-10-06 LAB — CBC
HCT: 47.3 % (ref 39.0–52.0)
Hemoglobin: 15.2 g/dL (ref 13.0–17.0)
MCH: 30.3 pg (ref 26.0–34.0)
MCHC: 32.1 g/dL (ref 30.0–36.0)
MCV: 94.2 fL (ref 80.0–100.0)
Platelets: 226 10*3/uL (ref 150–400)
RBC: 5.02 MIL/uL (ref 4.22–5.81)
RDW: 11.3 % — ABNORMAL LOW (ref 11.5–15.5)
WBC: 10 10*3/uL (ref 4.0–10.5)
nRBC: 0 % (ref 0.0–0.2)

## 2020-10-06 MED ORDER — SODIUM CHLORIDE 0.9 % IV SOLN
INTRAVENOUS | Status: AC | PRN
  Administered 2020-10-06: 1000 mL via INTRAVENOUS

## 2020-10-06 MED ORDER — FENTANYL 2500MCG IN NS 250ML (10MCG/ML) PREMIX INFUSION
25.0000 ug/h | INTRAVENOUS | Status: DC
  Administered 2020-10-07: 50 ug/h via INTRAVENOUS
  Filled 2020-10-06: qty 250

## 2020-10-06 MED ORDER — CLEVIDIPINE BUTYRATE 0.5 MG/ML IV EMUL
0.0000 mg/h | INTRAVENOUS | Status: DC
  Administered 2020-10-06: 1 mg/h via INTRAVENOUS
  Administered 2020-10-07: 21 mg/h via INTRAVENOUS
  Administered 2020-10-07: 18 mg/h via INTRAVENOUS
  Administered 2020-10-07: 21 mg/h via INTRAVENOUS
  Administered 2020-10-07 (×3): 18 mg/h via INTRAVENOUS
  Administered 2020-10-07: 21 mg/h via INTRAVENOUS
  Administered 2020-10-07: 8 mg/h via INTRAVENOUS
  Administered 2020-10-07: 10 mg/h via INTRAVENOUS
  Administered 2020-10-07: 21 mg/h via INTRAVENOUS
  Administered 2020-10-07 – 2020-10-08 (×3): 10 mg/h via INTRAVENOUS
  Filled 2020-10-06: qty 50
  Filled 2020-10-06: qty 100
  Filled 2020-10-06: qty 50
  Filled 2020-10-06 (×3): qty 100
  Filled 2020-10-06 (×5): qty 50

## 2020-10-06 MED ORDER — PROPOFOL 1000 MG/100ML IV EMUL
INTRAVENOUS | Status: AC
  Administered 2020-10-06: 20 ug/kg/min via INTRAVENOUS
  Filled 2020-10-06: qty 100

## 2020-10-06 MED ORDER — PROPOFOL 1000 MG/100ML IV EMUL
INTRAVENOUS | Status: AC | PRN
  Administered 2020-10-06: 20 ug via INTRAVENOUS

## 2020-10-06 MED ORDER — FENTANYL CITRATE (PF) 100 MCG/2ML IJ SOLN
50.0000 ug | Freq: Once | INTRAMUSCULAR | Status: AC
  Administered 2020-10-07: 50 ug via INTRAVENOUS
  Filled 2020-10-06: qty 2

## 2020-10-06 MED ORDER — FENTANYL BOLUS VIA INFUSION
50.0000 ug | INTRAVENOUS | Status: DC | PRN
  Filled 2020-10-06: qty 50

## 2020-10-06 MED ORDER — PROPOFOL 1000 MG/100ML IV EMUL: 0.0000 ug/kg/min | INTRAVENOUS | Status: DC

## 2020-10-06 MED ORDER — ROCURONIUM BROMIDE 50 MG/5ML IV SOLN
INTRAVENOUS | Status: AC | PRN
  Administered 2020-10-06: 70 mg via INTRAVENOUS

## 2020-10-06 MED ORDER — ETOMIDATE 2 MG/ML IV SOLN
INTRAVENOUS | Status: AC | PRN
  Administered 2020-10-06: 30 mg via INTRAVENOUS

## 2020-10-06 MED ORDER — SODIUM CHLORIDE 0.9 % IV BOLUS (SEPSIS): 1000.0000 mL | Freq: Once | INTRAVENOUS | Status: DC

## 2020-10-06 MED ORDER — SODIUM CHLORIDE 0.9 % IV BOLUS (SEPSIS)
1000.0000 mL | Freq: Once | INTRAVENOUS | Status: AC
  Administered 2020-10-06: 1000 mL via INTRAVENOUS
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
## 2020-10-06 NOTE — Code Documentation (Addendum)
Verbal order by ED provider Card[facility] at bedside for soft medical restraints.

## 2020-10-06 NOTE — Progress Notes (Signed)
Patient transported from ED Room 16 to CT and back with no complications.
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
## 2020-10-06 NOTE — Code Documentation (Signed)
Dr Eudelia Bunch intubating at this time.

## 2020-10-06 NOTE — Code Documentation (Addendum)
Respiratory at bedside, pt gaging
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
## 2020-10-06 NOTE — Progress Notes (Addendum)
   10/06/20 2317  Clinical Encounter Type  Visited With Patient not available  Visit Type Trauma  Referral From Nurse  Consult/Referral To Chaplain  Chaplain remains available if needed. This note was prepared by Deneen Harts, M.Div..  For questions please contact by phone [phone].

## 2020-10-06 NOTE — Code Documentation (Addendum)
Positive color change. 7.5 ett. On right lip 25cm.
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
## 2020-10-06 NOTE — Code Documentation (Signed)
Xray at bediside

## 2020-10-06 NOTE — ED Notes (Signed)
Patient transported to CT on monitor, respiratory at bedside
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
## 2020-10-07 ENCOUNTER — Encounter (HOSPITAL_COMMUNITY): Admission: EM | Disposition: A | Discharge status: Home / Self Care | Payer: Self-pay | Source: Home / Self Care

## 2020-10-07 ENCOUNTER — Inpatient Hospital Stay (HOSPITAL_COMMUNITY): Payer: Self-pay | Admitting: Certified Registered Nurse Anesthetist

## 2020-10-07 ENCOUNTER — Encounter (HOSPITAL_COMMUNITY): Payer: Self-pay

## 2020-10-07 DIAGNOSIS — S065X9A Traumatic subdural hemorrhage with loss of consciousness of unspecified duration, initial encounter: Secondary | ICD-10-CM | POA: Diagnosis present

## 2020-10-07 DIAGNOSIS — S065XAA Traumatic subdural hemorrhage with loss of consciousness status unknown, initial encounter: Secondary | ICD-10-CM | POA: Diagnosis present

## 2020-10-07 HISTORY — PX: CRANIOTOMY: SHX93

## 2020-10-07 LAB — URINALYSIS, COMPLETE (UACMP) WITH MICROSCOPIC
Bacteria, UA: NONE SEEN
Bilirubin Urine: NEGATIVE
Glucose, UA: >=500 mg/dL — AB
Ketones, ur: NEGATIVE mg/dL
Leukocytes,Ua: NEGATIVE
Nitrite: NEGATIVE
Protein, ur: NEGATIVE mg/dL
Specific Gravity, Urine: 1.012 (ref 1.005–1.030)
pH: 5 (ref 5.0–8.0)

## 2020-10-07 LAB — CBC
HCT: 39.5 % (ref 39.0–52.0)
Hemoglobin: 13.4 g/dL (ref 13.0–17.0)
MCH: 31.2 pg (ref 26.0–34.0)
MCHC: 33.9 g/dL (ref 30.0–36.0)
MCV: 91.9 fL (ref 80.0–100.0)
Platelets: 235 10*3/uL (ref 150–400)
RBC: 4.3 MIL/uL (ref 4.22–5.81)
RDW: 11.4 % — ABNORMAL LOW (ref 11.5–15.5)
WBC: 19.3 10*3/uL — ABNORMAL HIGH (ref 4.0–10.5)
nRBC: 0 % (ref 0.0–0.2)

## 2020-10-07 LAB — COMPREHENSIVE METABOLIC PANEL
ALT: 17 U/L (ref 0–44)
AST: 30 U/L (ref 15–41)
Albumin: 4.4 g/dL (ref 3.5–5.0)
Alkaline Phosphatase: 47 U/L (ref 38–126)
Anion gap: 14 (ref 5–15)
BUN: 11 mg/dL (ref 6–20)
CO2: 23 mmol/L (ref 22–32)
Calcium: 9.6 mg/dL (ref 8.9–10.3)
Chloride: 106 mmol/L (ref 98–111)
Creatinine, Ser: 1.11 mg/dL (ref 0.61–1.24)
GFR, Estimated: >60 mL/min (ref >60)
Glucose, Bld: 177 mg/dL — ABNORMAL HIGH (ref 70–99)
Potassium: 3.6 mmol/L (ref 3.5–5.1)
Sodium: 143 mmol/L (ref 135–145)
Total Bilirubin: 0.6 mg/dL (ref 0.3–1.2)
Total Protein: 7.3 g/dL (ref 6.5–8.1)

## 2020-10-07 LAB — BASIC METABOLIC PANEL
Anion gap: 8 (ref 5–15)
BUN: 11 mg/dL (ref 6–20)
CO2: 23 mmol/L (ref 22–32)
Calcium: 8.2 mg/dL — ABNORMAL LOW (ref 8.9–10.3)
Chloride: 106 mmol/L (ref 98–111)
Creatinine, Ser: 0.92 mg/dL (ref 0.61–1.24)
GFR, Estimated: >60 mL/min (ref >60)
Glucose, Bld: 193 mg/dL — ABNORMAL HIGH (ref 70–99)
Potassium: 4.1 mmol/L (ref 3.5–5.1)
Sodium: 137 mmol/L (ref 135–145)

## 2020-10-07 LAB — I-STAT ARTERIAL BLOOD GAS, ED
Acid-base deficit: 2 mmol/L (ref 0.0–2.0)
Bicarbonate: 20.1 mmol/L (ref 20.0–28.0)
Calcium, Ion: 1.1 mmol/L — ABNORMAL LOW (ref 1.15–1.40)
HCT: 42 % (ref 39.0–52.0)
Hemoglobin: 14.3 g/dL (ref 13.0–17.0)
O2 Saturation: 100 %
Patient temperature: 97.4
Potassium: 3.4 mmol/L — ABNORMAL LOW (ref 3.5–5.1)
Sodium: 137 mmol/L (ref 135–145)
TCO2: 21 mmol/L — ABNORMAL LOW (ref 22–32)
pCO2 arterial: 25.3 mmHg — ABNORMAL LOW (ref 32.0–48.0)
pH, Arterial: 7.506 — ABNORMAL HIGH (ref 7.350–7.450)
pO2, Arterial: 213 mmHg — ABNORMAL HIGH (ref 83.0–108.0)

## 2020-10-07 LAB — RAPID URINE DRUG SCREEN, HOSP PERFORMED
Amphetamines: NOT DETECTED
Barbiturates: NOT DETECTED
Benzodiazepines: POSITIVE — AB
Cocaine: NOT DETECTED
Opiates: NOT DETECTED
Tetrahydrocannabinol: POSITIVE — AB

## 2020-10-07 LAB — RESP PANEL BY RT-PCR (FLU A&B, COVID) ARPGX2
Influenza A by PCR: NEGATIVE
Influenza B by PCR: NEGATIVE
SARS Coronavirus 2 by RT PCR: POSITIVE — AB

## 2020-10-07 LAB — MRSA PCR SCREENING: MRSA by PCR: NEGATIVE

## 2020-10-07 LAB — HIV ANTIBODY (ROUTINE TESTING W REFLEX): HIV Screen 4th Generation wRfx: NONREACTIVE

## 2020-10-07 LAB — TRIGLYCERIDES: Triglycerides: 366 mg/dL — ABNORMAL HIGH (ref <150)

## 2020-10-07 LAB — CDS SEROLOGY

## 2020-10-07 LAB — PROTIME-INR
INR: 1.1 (ref 0.8–1.2)
Prothrombin Time: 14 seconds (ref 11.4–15.2)

## 2020-10-07 SURGERY — CRANIOTOMY HEMATOMA EVACUATION SUBDURAL
Anesthesia: General | Site: Head | Laterality: Right

## 2020-10-07 MED ORDER — FENTANYL CITRATE (PF) 100 MCG/2ML IJ SOLN
INTRAMUSCULAR | Status: DC | PRN
  Administered 2020-10-07: 100 ug via INTRAVENOUS
  Administered 2020-10-07: 150 ug via INTRAVENOUS

## 2020-10-07 MED ORDER — ONDANSETRON HCL 4 MG/2ML IJ SOLN
INTRAMUSCULAR | Status: AC
  Filled 2020-10-07: qty 2

## 2020-10-07 MED ORDER — POLYETHYLENE GLYCOL 3350 17 G PO PACK: 17.0000 g | PACK | Freq: Every day | ORAL | Status: DC

## 2020-10-07 MED ORDER — SODIUM CHLORIDE 0.9 % IV SOLN: INTRAVENOUS | Status: DC

## 2020-10-07 MED ORDER — ORAL CARE MOUTH RINSE
15.0000 mL | OROMUCOSAL | Status: DC
  Administered 2020-10-07 (×3): 15 mL via OROMUCOSAL

## 2020-10-07 MED ORDER — CHLORHEXIDINE GLUCONATE CLOTH 2 % EX PADS
6.0000 | MEDICATED_PAD | Freq: Every day | CUTANEOUS | Status: DC
  Administered 2020-10-07 – 2020-10-12 (×3): 6 via TOPICAL

## 2020-10-07 MED ORDER — BUPIVACAINE HCL (PF) 0.5 % IJ SOLN
INTRAMUSCULAR | Status: AC
  Filled 2020-10-07: qty 30

## 2020-10-07 MED ORDER — DOCUSATE SODIUM 50 MG/5ML PO LIQD: 100.0000 mg | Freq: Two times a day (BID) | ORAL | Status: DC

## 2020-10-07 MED ORDER — PROPOFOL 1000 MG/100ML IV EMUL
0.0000 ug/kg/min | INTRAVENOUS | Status: DC
  Administered 2020-10-07: 40 ug/kg/min via INTRAVENOUS

## 2020-10-07 MED ORDER — ORAL CARE MOUTH RINSE
15.0000 mL | Freq: Two times a day (BID) | OROMUCOSAL | Status: DC
  Administered 2020-10-07 – 2020-10-12 (×10): 15 mL via OROMUCOSAL

## 2020-10-07 MED ORDER — LABETALOL HCL 5 MG/ML IV SOLN
10.0000 mg | INTRAVENOUS | Status: DC | PRN
  Administered 2020-10-07: 20 mg via INTRAVENOUS
  Filled 2020-10-07: qty 8

## 2020-10-07 MED ORDER — ROCURONIUM BROMIDE 100 MG/10ML IV SOLN
INTRAVENOUS | Status: DC | PRN
  Administered 2020-10-07: 60 mg via INTRAVENOUS
  Administered 2020-10-07: 100 mg via INTRAVENOUS

## 2020-10-07 MED ORDER — LIDOCAINE-EPINEPHRINE 1 %-1:100000 IJ SOLN
INTRAMUSCULAR | Status: AC
  Filled 2020-10-07: qty 1

## 2020-10-07 MED ORDER — PROPOFOL 10 MG/ML IV BOLUS
INTRAVENOUS | Status: AC
  Filled 2020-10-07: qty 20

## 2020-10-07 MED ORDER — 0.9 % SODIUM CHLORIDE (POUR BTL) OPTIME
TOPICAL | Status: DC | PRN
  Administered 2020-10-07 (×2): 1000 mL

## 2020-10-07 MED ORDER — CEFAZOLIN SODIUM-DEXTROSE 2-4 GM/100ML-% IV SOLN
2.0000 g | Freq: Three times a day (TID) | INTRAVENOUS | Status: AC
  Administered 2020-10-07 – 2020-10-08 (×3): 2 g via INTRAVENOUS
  Filled 2020-10-07 (×3): qty 100

## 2020-10-07 MED ORDER — THROMBIN 5000 UNITS EX SOLR
CUTANEOUS | Status: AC
  Filled 2020-10-07: qty 5000

## 2020-10-07 MED ORDER — HYDROMORPHONE HCL 1 MG/ML IJ SOLN
0.5000 mg | INTRAMUSCULAR | Status: DC | PRN
Start: 2020-10-07 — End: 2020-10-08
  Administered 2020-10-07: 1 mg via INTRAVENOUS
  Filled 2020-10-07: qty 1

## 2020-10-07 MED ORDER — THROMBIN 20000 UNITS EX SOLR
CUTANEOUS | Status: DC | PRN
  Administered 2020-10-07: 20 mL via TOPICAL

## 2020-10-07 MED ORDER — SODIUM CHLORIDE 0.9 % IV SOLN: INTRAVENOUS | Status: DC | PRN

## 2020-10-07 MED ORDER — ROCURONIUM BROMIDE 10 MG/ML (PF) SYRINGE
PREFILLED_SYRINGE | INTRAVENOUS | Status: AC
  Filled 2020-10-07: qty 20

## 2020-10-07 MED ORDER — MORPHINE SULFATE (PF) 2 MG/ML IV SOLN
2.0000 mg | INTRAVENOUS | Status: DC | PRN
Start: 2020-10-06 — End: 2020-10-09
  Administered 2020-10-08: 4 mg via INTRAVENOUS
  Filled 2020-10-07: qty 2

## 2020-10-07 MED ORDER — ONDANSETRON HCL 4 MG/2ML IJ SOLN
4.0000 mg | INTRAMUSCULAR | Status: DC | PRN
  Administered 2020-10-07 (×2): 4 mg via INTRAVENOUS
  Filled 2020-10-07 (×2): qty 2

## 2020-10-07 MED ORDER — LEVETIRACETAM IN NACL 500 MG/100ML IV SOLN
500.0000 mg | Freq: Two times a day (BID) | INTRAVENOUS | Status: DC
  Administered 2020-10-07 (×3): 500 mg via INTRAVENOUS
  Filled 2020-10-07 (×3): qty 100

## 2020-10-07 MED ORDER — FENTANYL 2500MCG IN NS 250ML (10MCG/ML) PREMIX INFUSION
50.0000 ug/h | INTRAVENOUS | Status: DC
  Administered 2020-10-07: 150 ug/h via INTRAVENOUS

## 2020-10-07 MED ORDER — PANTOPRAZOLE SODIUM 40 MG IV SOLR
40.0000 mg | Freq: Every day | INTRAVENOUS | Status: DC
  Administered 2020-10-07: 40 mg via INTRAVENOUS
  Filled 2020-10-07: qty 40

## 2020-10-07 MED ORDER — PROMETHAZINE HCL 12.5 MG PO TABS: 12.5000 mg | ORAL_TABLET | ORAL | Status: DC | PRN

## 2020-10-07 MED ORDER — FENTANYL BOLUS VIA INFUSION
50.0000 ug | INTRAVENOUS | Status: DC | PRN
  Filled 2020-10-07: qty 100

## 2020-10-07 MED ORDER — CHLORHEXIDINE GLUCONATE 0.12% ORAL RINSE (MEDLINE KIT)
15.0000 mL | Freq: Two times a day (BID) | OROMUCOSAL | Status: DC
  Administered 2020-10-07: 15 mL via OROMUCOSAL

## 2020-10-07 MED ORDER — DEXAMETHASONE SODIUM PHOSPHATE 10 MG/ML IJ SOLN
INTRAMUSCULAR | Status: DC | PRN
  Administered 2020-10-07: 10 mg via INTRAVENOUS

## 2020-10-07 MED ORDER — BUPIVACAINE HCL 0.5 % IJ SOLN
INTRAMUSCULAR | Status: DC | PRN
  Administered 2020-10-07: 5 mL

## 2020-10-07 MED ORDER — LIDOCAINE-EPINEPHRINE 1 %-1:100000 IJ SOLN
INTRAMUSCULAR | Status: DC | PRN
  Administered 2020-10-07: 5 mL via INTRADERMAL

## 2020-10-07 MED ORDER — PROPOFOL 1000 MG/100ML IV EMUL
0.0000 ug/kg/min | INTRAVENOUS | Status: DC
  Administered 2020-10-07: 75 ug/kg/min via INTRAVENOUS

## 2020-10-07 MED ORDER — SCOPOLAMINE 1 MG/3DAYS TD PT72
1.0000 | MEDICATED_PATCH | TRANSDERMAL | Status: DC | PRN
  Administered 2020-10-08: 1.5 mg via TRANSDERMAL
  Filled 2020-10-07: qty 1

## 2020-10-07 MED ORDER — THROMBIN 5000 UNITS EX SOLR
OROMUCOSAL | Status: DC | PRN
  Administered 2020-10-07: 5 mL via TOPICAL

## 2020-10-07 MED ORDER — CEFAZOLIN SODIUM-DEXTROSE 2-4 GM/100ML-% IV SOLN
2.0000 g | Freq: Three times a day (TID) | INTRAVENOUS | Status: DC
  Administered 2020-10-07 (×2): 2 g via INTRAVENOUS
  Filled 2020-10-07 (×3): qty 100

## 2020-10-07 MED ORDER — ONDANSETRON HCL 4 MG PO TABS: 4.0000 mg | ORAL_TABLET | ORAL | Status: DC | PRN

## 2020-10-07 MED ORDER — THROMBIN 20000 UNITS EX SOLR
CUTANEOUS | Status: AC
  Filled 2020-10-07: qty 20000

## 2020-10-07 MED ORDER — BACITRACIN ZINC 500 UNIT/GM EX OINT
TOPICAL_OINTMENT | CUTANEOUS | Status: AC
  Filled 2020-10-07: qty 28.35

## 2020-10-07 MED ORDER — DEXAMETHASONE SODIUM PHOSPHATE 10 MG/ML IJ SOLN
INTRAMUSCULAR | Status: AC
  Filled 2020-10-07: qty 1

## 2020-10-07 MED ORDER — SENNOSIDES-DOCUSATE SODIUM 8.6-50 MG PO TABS: 1.0000 | ORAL_TABLET | Freq: Every evening | ORAL | Status: DC | PRN

## 2020-10-07 MED ORDER — FENTANYL CITRATE (PF) 100 MCG/2ML IJ SOLN
50.0000 ug | Freq: Once | INTRAMUSCULAR | Status: AC
Start: 2020-10-07 — End: 2020-10-07
  Administered 2020-10-07: 50 ug via INTRAVENOUS

## 2020-10-07 MED ORDER — FENTANYL CITRATE (PF) 250 MCG/5ML IJ SOLN
INTRAMUSCULAR | Status: AC
  Filled 2020-10-07: qty 5

## 2020-10-07 MED ORDER — DEXMEDETOMIDINE HCL IN NACL 400 MCG/100ML IV SOLN
0.0000 ug/kg/h | INTRAVENOUS | Status: AC
  Administered 2020-10-07: 1 ug/kg/h via INTRAVENOUS
  Administered 2020-10-07: 1.2 ug/kg/h via INTRAVENOUS
  Administered 2020-10-07: 0.4 ug/kg/h via INTRAVENOUS
  Administered 2020-10-08: 0.8 ug/kg/h via INTRAVENOUS
  Administered 2020-10-08: 1.2 ug/kg/h via INTRAVENOUS
  Administered 2020-10-08: 1 ug/kg/h via INTRAVENOUS
  Administered 2020-10-09: 0.9 ug/kg/h via INTRAVENOUS
  Administered 2020-10-09: 0.5 ug/kg/h via INTRAVENOUS
  Administered 2020-10-09: 0.6 ug/kg/h via INTRAVENOUS
  Administered 2020-10-09: 0.9 ug/kg/h via INTRAVENOUS
  Administered 2020-10-10: 0.5 ug/kg/h via INTRAVENOUS
  Filled 2020-10-07 (×12): qty 100

## 2020-10-07 MED ORDER — FENTANYL CITRATE (PF) 100 MCG/2ML IJ SOLN
INTRAMUSCULAR | Status: AC | PRN
  Administered 2020-10-07: 50 ug via INTRAVENOUS

## 2020-10-07 MED ORDER — ONDANSETRON HCL 4 MG/2ML IJ SOLN
INTRAMUSCULAR | Status: DC | PRN
  Administered 2020-10-07: 4 mg via INTRAVENOUS

## 2020-10-07 MED ORDER — ACETAMINOPHEN 325 MG PO TABS: 650.0000 mg | ORAL_TABLET | ORAL | Status: DC | PRN

## 2020-10-07 SURGICAL SUPPLY — 81 items
BIT DRILL WIRE PASS 1.3MM (BIT) IMPLANT
BLADE CLIPPER SURG (BLADE) ×2 IMPLANT
BUR ACORN 6.0 PRECISION (BURR) ×2 IMPLANT
BUR CARBIDE MATCH 3.0 (BURR) IMPLANT
BUR SPIRAL ROUTER 2.3 (BUR) ×2 IMPLANT
CANISTER SUCT 3000ML PPV (MISCELLANEOUS) ×4 IMPLANT
CARTRIDGE OIL MAESTRO DRILL (MISCELLANEOUS) ×1 IMPLANT
COVER BURR HOLE 14 (Orthopedic Implant) ×6 IMPLANT
COVER BURR HOLE UNIV 10 (Orthopedic Implant) ×2 IMPLANT
COVER WAND RF STERILE (DRAPES) ×2 IMPLANT
DIFFUSER DRILL AIR PNEUMATIC (MISCELLANEOUS) ×2 IMPLANT
DRAIN JACKSON PRATT 1/4 1325 (MISCELLANEOUS) IMPLANT
DRAIN JACKSON RD 7FR 3/32 (WOUND CARE) IMPLANT
DRAPE NEUROLOGICAL W/INCISE (DRAPES) ×2 IMPLANT
DRAPE SHEET LG 3/4 BI-LAMINATE (DRAPES) ×2 IMPLANT
DRAPE SURG 17X23 STRL (DRAPES) IMPLANT
DRAPE WARM FLUID 44X44 (DRAPES) ×2 IMPLANT
DRESSING AQUACEL AG SP 3.5X4 (GAUZE/BANDAGES/DRESSINGS) ×1 IMPLANT
DRESSING AQUACEL AG SP 3.5X6 (GAUZE/BANDAGES/DRESSINGS) ×3 IMPLANT
DRILL WIRE PASS 1.3MM (BIT)
DRSG AQUACEL AG ADV 3.5X 6 (GAUZE/BANDAGES/DRESSINGS) ×2 IMPLANT
DRSG AQUACEL AG SP 3.5X4 (GAUZE/BANDAGES/DRESSINGS) ×2
DRSG AQUACEL AG SP 3.5X6 (GAUZE/BANDAGES/DRESSINGS) ×6
DURAPREP 6ML APPLICATOR 50/CS (WOUND CARE) ×2 IMPLANT
ELECT REM PT RETURN 9FT ADLT (ELECTROSURGICAL) ×2
ELECTRODE REM PT RTRN 9FT ADLT (ELECTROSURGICAL) ×1 IMPLANT
EVACUATOR SILICONE 100CC (DRAIN) IMPLANT
FORCEPS BIPO MALIS IRRIG 9X1.5 (NEUROSURGERY SUPPLIES) IMPLANT
GAUZE 4X4 16PLY RFD (DISPOSABLE) IMPLANT
GAUZE SPONGE 4X4 12PLY STRL (GAUZE/BANDAGES/DRESSINGS) IMPLANT
GLOVE ECLIPSE 8.0 STRL XLNG CF (GLOVE) ×4 IMPLANT
GLOVE EXAM NITRILE LRG STRL (GLOVE) IMPLANT
GLOVE EXAM NITRILE XL STR (GLOVE) IMPLANT
GLOVE EXAM NITRILE XS STR PU (GLOVE) IMPLANT
GLOVE SRG 8 PF TXTR STRL LF DI (GLOVE) ×2 IMPLANT
GLOVE SURG UNDER POLY LF SZ8 (GLOVE) ×2
GOWN STRL REUS W/ TWL LRG LVL3 (GOWN DISPOSABLE) ×1 IMPLANT
GOWN STRL REUS W/ TWL XL LVL3 (GOWN DISPOSABLE) ×1 IMPLANT
GOWN STRL REUS W/TWL 2XL LVL3 (GOWN DISPOSABLE) IMPLANT
GOWN STRL REUS W/TWL LRG LVL3 (GOWN DISPOSABLE) ×1
GOWN STRL REUS W/TWL XL LVL3 (GOWN DISPOSABLE) ×1
GRAFT DURAGEN MATRIX 5WX7L (Graft) ×2 IMPLANT
HEMOSTAT POWDER KIT SURGIFOAM (HEMOSTASIS) ×2 IMPLANT
HEMOSTAT SURGICEL 2X14 (HEMOSTASIS) IMPLANT
IV NS 1000ML (IV SOLUTION) ×1
IV NS 1000ML BAXH (IV SOLUTION) ×1 IMPLANT
KIT BASIN OR (CUSTOM PROCEDURE TRAY) ×2 IMPLANT
KIT TURNOVER KIT B (KITS) ×2 IMPLANT
NEEDLE HYPO 22GX1.5 SAFETY (NEEDLE) ×2 IMPLANT
NS IRRIG 1000ML POUR BTL (IV SOLUTION) ×4 IMPLANT
OIL CARTRIDGE MAESTRO DRILL (MISCELLANEOUS) ×2
PACK BATTERY CMF DISP FOR DVR (ORTHOPEDIC DISPOSABLE SUPPLIES) ×2 IMPLANT
PACK CRANIOTOMY CUSTOM (CUSTOM PROCEDURE TRAY) ×2 IMPLANT
PATTIES SURGICAL .5 X.5 (GAUZE/BANDAGES/DRESSINGS) IMPLANT
PATTIES SURGICAL .5 X3 (DISPOSABLE) IMPLANT
PATTIES SURGICAL 1X1 (DISPOSABLE) IMPLANT
PERFORATOR LRG  14-11MM (BIT) ×1
PERFORATOR LRG 14-11MM (BIT) ×1 IMPLANT
PLATE CRANIAL SHUNT 14 (Plate) ×2 IMPLANT
RETRACTOR LONE STAR DISPOSABLE (INSTRUMENTS) ×4 IMPLANT
SCREW UNIII AXS SD 1.5X4 (Screw) ×38 IMPLANT
SET TUBING IRRIGATION DISP (TUBING) IMPLANT
SPONGE NEURO XRAY DETECT 1X3 (DISPOSABLE) IMPLANT
SPONGE SURGIFOAM ABS GEL 100 (HEMOSTASIS) ×2 IMPLANT
STAPLER VISISTAT 35W (STAPLE) ×4 IMPLANT
STOCKINETTE 6  STRL (DRAPES) ×1
STOCKINETTE 6 STRL (DRAPES) ×1 IMPLANT
STRIP CLOSURE SKIN 1/2X4 (GAUZE/BANDAGES/DRESSINGS) IMPLANT
SUT ETHILON 3 0 FSL (SUTURE) IMPLANT
SUT ETHILON 3 0 PS 1 (SUTURE) IMPLANT
SUT NURALON 4 0 TR CR/8 (SUTURE) ×6 IMPLANT
SUT VIC AB 0 CT1 18XCR BRD8 (SUTURE) ×1 IMPLANT
SUT VIC AB 0 CT1 8-18 (SUTURE) ×1
SUT VIC AB 2-0 CP2 18 (SUTURE) ×8 IMPLANT
SUT VICRYL RAPIDE 4/0 PS 2 (SUTURE) ×2 IMPLANT
TOWEL GREEN STERILE (TOWEL DISPOSABLE) ×2 IMPLANT
TOWEL GREEN STERILE FF (TOWEL DISPOSABLE) ×2 IMPLANT
TRAY FOLEY MTR SLVR 16FR STAT (SET/KITS/TRAYS/PACK) IMPLANT
TUBE CONNECTING 12X1/4 (SUCTIONS) ×2 IMPLANT
UNDERPAD 30X36 HEAVY ABSORB (UNDERPADS AND DIAPERS) ×2 IMPLANT
WATER STERILE IRR 1000ML POUR (IV SOLUTION) ×2 IMPLANT

## 2020-10-07 NOTE — Progress Notes (Signed)
Patient self extubated. Maintaining saturation on nasal cannula after initial hypoxia. Will monitor.
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
## 2020-10-07 NOTE — Anesthesia Procedure Notes (Signed)
Arterial Line Insertion Start/End4/24/2022 1:25 AM, 10/07/2020 1:30 AM Performed by: Kipp Brood, MD, anesthesiologist  Preanesthetic checklist: patient identified, IV checked and site marked Right, radial was placed Catheter size: 20 G Hand hygiene performed   Attempts: 1 Procedure performed without using ultrasound guided technique. Following insertion, dressing applied and Biopatch. Patient tolerated the procedure well with no immediate complications.

## 2020-10-07 NOTE — Progress Notes (Signed)
During shift change, patient was crawling out of bed, saying "I need to pee", patient has a foley catheter at this time. Patient was redirected and shortly began vomiting. PRN Zofran given. Costella with neurosurgery made aware. Order for poesy belt and PRN Scopolamine patch placed at this time. Informed to continue to monitor neuro status.

## 2020-10-07 NOTE — Progress Notes (Signed)
Pt transported from OR to 4N23 on the vent without any complications.

## 2020-10-07 NOTE — ED Notes (Signed)
TRAUMA end

## 2020-10-07 NOTE — Progress Notes (Signed)
   Providing Compassionate, Quality Care - Together  NEUROSURGERY PROGRESS NOTE   S: No issues overnight.  Self extubated this morning  O: EXAM:  BP 131/69   Pulse 70   Temp (!) 97.3 F (36.3 C) (Axillary)   Resp (!) 24   Ht 6' (1.829 m)   Wt 72 kg   SpO2 98%   BMI 21.53 kg/m   Awake, alert, nonverbal Eyes open spontaneously, midline gaze PERRL Face symmetric Incision clean dry and intact Hemovac in place, mostly CSF with slight blood tinge Moving all extremities spontaneously, follows visual commands  ASSESSMENT:  25 y.o. male with   1.  Large acute right subdural hematoma, traumatic with midline shift  PLAN: -Continue supportive care -Antiepileptics -SBP less than 160 -Pain control -Maintain cervical collar until cleared -We will update mother via phone -Monitor Hemovac    Thank you for allowing me to participate in this patient's care.  Please do not hesitate to call with questions or concerns.   Monia Pouch, DO Neurosurgeon [facility] Cell: [phone]

## 2020-10-07 NOTE — Progress Notes (Signed)
Informed Dr. Jake Samples of patient's episode of vomiting. No new orders at this time.

## 2020-10-07 NOTE — ED Notes (Signed)
Verbal order restraints removed post intubation
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
## 2020-10-07 NOTE — Progress Notes (Addendum)
While providing patient care in another room, this RN was told by another RN that patient had self extubated. Patient had safety mitts on, was a RASS of -3 while this RN was previously in the patient's room, and patient made no purposeful movement toward ETT with hands while this RN was in there from 0400 to 0500 after admission. Patient had no order for restraints. Upon being told of self extubation, this RN had another RN notify respiratory therapy, charge nurse, and the attending MD. This RN put on appropriate PPE for a covid positive patient, and entered the room. Patient was initiating breaths on his own, with oxygen saturation in the low 80s. This RN used a bag valve mask connected to flowing oxygen on patient, and patient's oxygen saturation came up to the 90s. This RN used oral suction on patient. This RN turned off propofol and fentanyl infusions. Patient transitioned to non-rebreather mask, and then high flow nasal cannula (per verbal order of attending MD). Patient continued to breath on his own and maintain oxygen saturation.

## 2020-10-07 NOTE — Transfer of Care (Signed)
Immediate Anesthesia Transfer of Care Note  Patient: Brett Moreno  Procedure(s) Performed: RIGHT CRANIOTOMY HEMATOMA EVACUATION SUBDURAL (Right Head)  Patient Location: ICU  Anesthesia Type:General  Level of Consciousness: Patient remains intubated per anesthesia plan  Airway & Oxygen Therapy: Patient remains intubated per anesthesia plan and Patient placed on Ventilator (see vital sign flow sheet for setting)  Post-op Assessment: Report given to RN and Post -op Vital signs reviewed and stable  Post vital signs: Reviewed and stable  Last Vitals:  Vitals Value Taken Time  BP 118/69 10/07/20 0337  Temp    Pulse 71 10/07/20 0337  Resp 15 10/07/20 0337  SpO2 100 % 10/07/20 0337    Last Pain:  Vitals:   10/06/20 2335  TempSrc: Axillary  PainSc:          Complications: No complications documented.

## 2020-10-07 NOTE — Anesthesia Postprocedure Evaluation (Signed)
Anesthesia Post Note  Patient: Futures trader  Procedure(s) Performed: RIGHT CRANIOTOMY HEMATOMA EVACUATION SUBDURAL (Right Head)     Patient location during evaluation: NICU Anesthesia Type: General Level of consciousness: sedated and patient remains intubated per anesthesia plan Pain management: pain level controlled Vital Signs Assessment: post-procedure vital signs reviewed and stable Respiratory status: patient remains intubated per anesthesia plan and patient on ventilator - see flowsheet for VS Cardiovascular status: stable Anesthetic complications: no   No complications documented.  Last Vitals:  Vitals:   10/07/20 0040 10/07/20 0337  BP: 114/67 118/69  Pulse:  71  Resp: 14 15  Temp:    SpO2:  100%    Last Pain:  Vitals:   10/06/20 2335  TempSrc: Axillary  PainSc:                  Durwood Dittus COKER

## 2020-10-07 NOTE — Progress Notes (Signed)
RT was called into the room due to pt self extubated. Bagged pt for few minutes and put him on NRB pt is maintaining O2 saturation in the 98 and VSS. Pt placed on 15L salter per MD verbal order. RT will continue to monitor.
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
## 2020-10-07 NOTE — Progress Notes (Signed)
Patient transported from ED Room 16 to OR 21 with no complications.

## 2020-10-07 NOTE — ED Notes (Addendum)
Report given to OR, Mak[place] rn, pt transported upstairs on monitor.

## 2020-10-08 ENCOUNTER — Encounter (HOSPITAL_COMMUNITY): Payer: Self-pay | Admitting: Neurological Surgery

## 2020-10-08 LAB — POCT I-STAT 7, (LYTES, BLD GAS, ICA,H+H)
Acid-base deficit: 2 mmol/L (ref 0.0–2.0)
Bicarbonate: 21.9 mmol/L (ref 20.0–28.0)
Calcium, Ion: 1.1 mmol/L — ABNORMAL LOW (ref 1.15–1.40)
HCT: 35 % — ABNORMAL LOW (ref 39.0–52.0)
Hemoglobin: 11.9 g/dL — ABNORMAL LOW (ref 13.0–17.0)
O2 Saturation: 100 %
Patient temperature: 34.8
Potassium: 3.9 mmol/L (ref 3.5–5.1)
Sodium: 139 mmol/L (ref 135–145)
TCO2: 23 mmol/L (ref 22–32)
pCO2 arterial: 31.9 mmHg — ABNORMAL LOW (ref 32.0–48.0)
pH, Arterial: 7.435 (ref 7.350–7.450)
pO2, Arterial: 308 mmHg — ABNORMAL HIGH (ref 83.0–108.0)

## 2020-10-08 MED ORDER — POLYETHYLENE GLYCOL 3350 17 G PO PACK
17.0000 g | PACK | Freq: Every day | ORAL | Status: DC
  Administered 2020-10-10: 17 g via ORAL
  Filled 2020-10-08 (×3): qty 1

## 2020-10-08 MED ORDER — ONDANSETRON HCL 4 MG PO TABS: 4.0000 mg | ORAL_TABLET | ORAL | Status: DC | PRN

## 2020-10-08 MED ORDER — PANTOPRAZOLE SODIUM 40 MG PO TBEC: 40.0000 mg | DELAYED_RELEASE_TABLET | Freq: Every day | ORAL | Status: DC

## 2020-10-08 MED ORDER — DOCUSATE SODIUM 100 MG PO CAPS
100.0000 mg | ORAL_CAPSULE | Freq: Two times a day (BID) | ORAL | Status: DC
  Administered 2020-10-08 – 2020-10-11 (×6): 100 mg via ORAL
  Filled 2020-10-08 (×8): qty 1

## 2020-10-08 MED ORDER — LEVETIRACETAM 100 MG/ML PO SOLN: 500.0000 mg | Freq: Two times a day (BID) | ORAL | Status: DC

## 2020-10-08 MED ORDER — SODIUM CHLORIDE 0.9 % IV SOLN: 25.0000 mg | Freq: Once | INTRAVENOUS | Status: DC

## 2020-10-08 MED ORDER — ACETAMINOPHEN 325 MG PO TABS: 650.0000 mg | ORAL_TABLET | ORAL | Status: DC | PRN

## 2020-10-08 MED ORDER — PANTOPRAZOLE SODIUM 40 MG PO PACK: 40.0000 mg | PACK | Freq: Every day | ORAL | Status: DC

## 2020-10-08 MED ORDER — OXYCODONE HCL 5 MG PO TABS
5.0000 mg | ORAL_TABLET | ORAL | Status: DC | PRN
  Administered 2020-10-09: 10 mg via ORAL
  Administered 2020-10-09 – 2020-10-10 (×3): 5 mg via ORAL
  Administered 2020-10-10 – 2020-10-11 (×3): 10 mg via ORAL
  Filled 2020-10-08 (×2): qty 2
  Filled 2020-10-08 (×2): qty 1
  Filled 2020-10-08: qty 2
  Filled 2020-10-08: qty 1
  Filled 2020-10-08: qty 2
  Filled 2020-10-08: qty 1
  Filled 2020-10-08: qty 2

## 2020-10-08 MED ORDER — LORAZEPAM 2 MG/ML IJ SOLN
1.0000 mg | INTRAMUSCULAR | Status: DC | PRN
  Administered 2020-10-08 – 2020-10-10 (×4): 1 mg via INTRAVENOUS
  Filled 2020-10-08 (×4): qty 1

## 2020-10-08 MED ORDER — LEVETIRACETAM 500 MG PO TABS
500.0000 mg | ORAL_TABLET | Freq: Two times a day (BID) | ORAL | Status: DC
  Administered 2020-10-08 – 2020-10-12 (×9): 500 mg via ORAL
  Filled 2020-10-08 (×9): qty 1

## 2020-10-08 MED ORDER — DIPHENHYDRAMINE HCL 50 MG/ML IJ SOLN
25.0000 mg | Freq: Once | INTRAMUSCULAR | Status: AC
  Administered 2020-10-08: 25 mg via INTRAVENOUS

## 2020-10-08 MED ORDER — HYDROMORPHONE HCL 1 MG/ML IJ SOLN
0.5000 mg | INTRAMUSCULAR | Status: DC | PRN
  Administered 2020-10-09: 1 mg via INTRAVENOUS
  Filled 2020-10-08: qty 1

## 2020-10-08 MED ORDER — ONDANSETRON HCL 4 MG/2ML IJ SOLN: 4.0000 mg | INTRAMUSCULAR | Status: DC | PRN

## 2020-10-08 MED ORDER — SENNOSIDES-DOCUSATE SODIUM 8.6-50 MG PO TABS: 1.0000 | ORAL_TABLET | Freq: Every evening | ORAL | Status: DC | PRN
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
## 2020-10-09 LAB — BASIC METABOLIC PANEL
Anion gap: 8 (ref 5–15)
BUN: 12 mg/dL (ref 6–20)
CO2: 28 mmol/L (ref 22–32)
Calcium: 9.1 mg/dL (ref 8.9–10.3)
Chloride: 106 mmol/L (ref 98–111)
Creatinine, Ser: 0.99 mg/dL (ref 0.61–1.24)
GFR, Estimated: >60 mL/min (ref >60)
Glucose, Bld: 108 mg/dL — ABNORMAL HIGH (ref 70–99)
Potassium: 3.9 mmol/L (ref 3.5–5.1)
Sodium: 142 mmol/L (ref 135–145)

## 2020-10-09 LAB — CBC
HCT: 35.9 % — ABNORMAL LOW (ref 39.0–52.0)
Hemoglobin: 12.1 g/dL — ABNORMAL LOW (ref 13.0–17.0)
MCH: 30.8 pg (ref 26.0–34.0)
MCHC: 33.7 g/dL (ref 30.0–36.0)
MCV: 91.3 fL (ref 80.0–100.0)
Platelets: 155 10*3/uL (ref 150–400)
RBC: 3.93 MIL/uL — ABNORMAL LOW (ref 4.22–5.81)
RDW: 11.2 % — ABNORMAL LOW (ref 11.5–15.5)
WBC: 12 10*3/uL — ABNORMAL HIGH (ref 4.0–10.5)
nRBC: 0 % (ref 0.0–0.2)

## 2020-10-09 MED ORDER — HYDROMORPHONE HCL 1 MG/ML IJ SOLN: 0.5000 mg | INTRAMUSCULAR | Status: DC | PRN

## 2020-10-09 MED ORDER — HEPARIN SODIUM (PORCINE) 5000 UNIT/ML IJ SOLN
5000.0000 [IU] | Freq: Two times a day (BID) | INTRAMUSCULAR | Status: DC
  Administered 2020-10-09 – 2020-10-12 (×7): 5000 [IU] via SUBCUTANEOUS
  Filled 2020-10-09 (×7): qty 1

## 2020-10-09 MED ORDER — ACETAMINOPHEN 500 MG PO TABS
1000.0000 mg | ORAL_TABLET | Freq: Four times a day (QID) | ORAL | Status: DC
  Administered 2020-10-09 – 2020-10-12 (×10): 1000 mg via ORAL
  Filled 2020-10-09 (×10): qty 2

## 2020-10-09 MED ORDER — METHOCARBAMOL 500 MG PO TABS
1000.0000 mg | ORAL_TABLET | Freq: Three times a day (TID) | ORAL | Status: DC
  Administered 2020-10-09 – 2020-10-11 (×5): 1000 mg via ORAL
  Filled 2020-10-09 (×5): qty 2

## 2020-10-09 MED ORDER — HYDRALAZINE HCL 20 MG/ML IJ SOLN: 10.0000 mg | INTRAMUSCULAR | Status: DC | PRN
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
## 2020-10-10 ENCOUNTER — Inpatient Hospital Stay (HOSPITAL_COMMUNITY): Payer: Self-pay

## 2020-10-10 MED ORDER — DEXMEDETOMIDINE HCL IN NACL 400 MCG/100ML IV SOLN
0.4000 ug/kg/h | INTRAVENOUS | Status: DC
  Administered 2020-10-10: 0.5 ug/kg/h via INTRAVENOUS
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
## 2020-10-10 NOTE — Progress Notes (Signed)
Inpatient Rehab Admissions Coordinator Note:   Per therapy recommendations, pt was screened for CIR candidacy by Estill Dooms, PT, DPT.  At this time we are recommending a CIR consult and I will place an order per our protocol.  Please contact me with questions.    Estill Dooms, PT, DPT [phone] 10/10/20 8:52 AM

## 2020-10-11 MED ORDER — METHOCARBAMOL 500 MG PO TABS
1000.0000 mg | ORAL_TABLET | Freq: Three times a day (TID) | ORAL | Status: DC | PRN
  Administered 2020-10-11: 1000 mg via ORAL
  Filled 2020-10-11 (×2): qty 2
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
## 2020-10-11 NOTE — Progress Notes (Signed)
Inpatient Rehab Admissions Coordinator:   Spoke to patient's mother over the phone regarding possible CIR stay.  Gave her info on average length of stay of 2 weeks (but that it varies based on progress) and ultimately that pt would likely need supervision once discharged.  She is home during the day and can provide supervision for patient.  We did discuss that she feels he is anxious to get home, and that with his routine being off she worries that extending his hospitalization with CIR may be detrimental to him and slow his overall progress.  I let her know that with brain injury recovery, we typically see specific behaviors that correlate to ongoing recovery and that agitation and wanting to go home are very typical behaviors.  I will continue to follow and will touch base with her in another couple of days to see if they would like to pursue CIR, but for now she is leaning towards home.   Estill Dooms, PT, DPT Admissions Coordinator [phone] 10/11/20  2:40 PM
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
## 2020-10-11 NOTE — Progress Notes (Signed)
Patient refused morning labs, patient educated on the importance of labs.  Will continue to monitor.
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
## 2020-10-12 ENCOUNTER — Other Ambulatory Visit (HOSPITAL_COMMUNITY): Payer: Self-pay

## 2020-10-12 MED ORDER — LEVETIRACETAM 500 MG PO TABS
500.0000 mg | ORAL_TABLET | Freq: Two times a day (BID) | ORAL | 0 refills | Status: DC
  Filled 2020-10-12: qty 6, 3d supply, fill #0

## 2020-10-12 MED ORDER — ACETAMINOPHEN 500 MG PO TABS: 1000.0000 mg | ORAL_TABLET | Freq: Four times a day (QID) | ORAL | 0 refills | Status: DC | PRN

## 2020-10-12 MED ORDER — OXYCODONE HCL 5 MG PO TABS
5.0000 mg | ORAL_TABLET | Freq: Four times a day (QID) | ORAL | 0 refills | Status: DC | PRN
  Filled 2020-10-12: qty 15, 4d supply, fill #0

## 2020-10-12 NOTE — Progress Notes (Signed)
Patient refuses lab draw. Compliance education given to patient
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
## 2020-10-12 NOTE — Discharge Instructions (Signed)
Craniotomy, Care After This sheet gives you information about how to care for yourself after your procedure. Your doctor may also give you more specific instructions. If you have problems or questions, contact your doctor. What can I expect after the procedure? After the procedure, it is common to have:  Headaches.  Loss of feeling (numbness) in some areas of your scalp.  Swelling and bruising to your face.  A scalp that feels spongy due to the fluid under it. This will slowly get better. Follow these instructions at home: Medicines  Take over-the-counter and prescription medicines only as told by your doctor.  Ask your doctor if the medicine prescribed to you can cause trouble pooping (constipation). You may need to take these actions to prevent or treat trouble pooping: ? Drink enough fluid to keep your pee (urine) pale yellow. ? Take over-the-counter or prescription medicines. ? Eat foods that are high in fiber. These include:  Beans.  Whole grains.  Fresh fruits.  Fresh vegetables. ? Limit foods that are high in fat and processed sugars. These include fried or sweet foods.   Incision care  Follow instructions from your doctor about how to take care of your cut from surgery (incision) and pin insertion areas. Make sure you: ? Wash your hands with soap and water for at least 20 seconds before you change your bandage (dressing). If you cannot use soap and water, use hand sanitizer. ? Change your bandage as told by your doctor. ? Leave stitches (sutures), staples, skin glue, or skin tape (adhesive) strips in place. They may need to stay in place for 2 weeks or longer. If tape strips get loose and curl up, you may trim the loose edges. Do not remove tape strips completely unless your doctor says it is okay.  Do not take baths, swim, or use a hot tub until your doctor says it is okay.  Check your cut area and pin insertion sites every day for signs of infection. Check  for: ? Redness, swelling, or pain. ? Fluid or blood. ? Warmth. ? Pus or a bad smell.   Activity  Do not lift anything that is heavier than 10 lb (4.5 kg), or the limit that you are told, until your doctor says that it is safe. This is usually about 2 weeks.  Rest when you get tired. You will need extra rest for many weeks. When you lie down: ? Keep your head raised (elevated) at a 30-degree angle. You can do this with pillows. This helps keep brain swelling down and prevents increased pressure in the head. Ask your doctor when you can go back to sleeping flat.  Limit your activities as told by your doctor. You may slowly increase your activities as told by your doctor.  Do not drive until your doctor says you can.  Ask your doctor when you can go back to work.  Avoid contact sports for 1 year or as told by your doctor.   If you have a helmet:  Wear the helmet as told by your doctor. Remove it only as told by your doctor.  When putting on the helmet: ? Make sure the chin strap is in front of your ears. ? Fasten the helmet securely. There should be room for you to slide only two fingers under your chin. ? Position the bottom edge of the helmet just above your eyebrows.  Do not remove the pads from inside the helmet.  Wash your helmet as needed with soap  and hot water. General instructions  Do not drink alcohol.  Keep all follow-up visits as told by your doctor. This is important. Contact a doctor if:  You have redness, swelling, or pain around your cut area or pin insertion areas.  You have fluid or blood coming from your cut area or pin insertion areas.  Your cut area or pin insertion areas feel warm to the touch.  You have pus or a bad smell coming from your cut area or pin insertion areas.  You have a fever. Get help right away if:  You have a very bad headache that does not go away with pain medicine.  You have uncontrolled shaking or jerking movements  (seizures).  You feel confused.  You feel like you may vomit (nauseous) and you have vomiting that does not stop.  You feel weak or lose feeling on one side of your body.  Your speech is slurred.  You have chest pain, a stiff neck, or trouble breathing.  You have blurry vision or loss of vision.  You have swelling or bruising around the eyes.  Your cut from surgery breaks open after the stitches or staples are taken out.  You feel dizzy.  You faint. Summary  After the procedure, it is common to have headaches and to lose feeling (have numbness) in some areas of the scalp.  Check your cut area and pin insertion areas every day for signs of infection. These include redness, swelling, drainage, or fever.  After the procedure, return to activities slowly. Rest when you get tired.  You may need to take actions to prevent trouble pooping (constipation). This may include drinking plenty of fluids and eating foods that are high in fiber. This information is not intended to replace advice given to you by your health care provider. Make sure you discuss any questions you have with your health care provider. Document Revised: 07/15/2019 Document Reviewed: 07/15/2019 Elsevier Patient Education  2021 Elsevier Inc.   Living With Traumatic Brain Injury Traumatic brain injury (TBI) is an injury to the brain that may be mild, moderate, or severe. Symptoms of any type of TBI can be long lasting (chronic). Depending on the area of the brain that is affected, a TBI can interfere with vision, memory, concentration, reasoning, speech, balance, sense of touch, and sleep. TBI can also cause chronic symptoms, such as headache or dizziness. You can take steps to help with recovery and make it easier to live with this condition. How to manage lifestyle changes After a TBI, you may need to make changes to your lifestyle in order to recover as well as possible. How quickly and how fully you recover will  depend on the severity of your injury. Following a rehabilitation plan You will likely work with specialists to develop a rehabilitation plan to help you return to your regular activities. Your health care team may include:  Physical or occupational therapists.  Speech and language pathologists.  Mental health counselors.  Physicians, such as your primary care physician or neurologist. Managing changes in activity Follow instructions from your health care provider about any activities you need to limit or avoid while you recover. This may include:  Taking time off work or school, depending on your injury.  Avoiding activities where there is a risk for another head injury, such as football, hockey, soccer, basketball, martial arts, downhill snow sports, and horseback riding. Do not do these activities until your health care provider approves.  Avoiding driving. Your ability  to drive safely may be affected by your injury. ? Rely on family, friends, or a transportation service to help you get around and to appointments. ? Have a professional evaluation to check your driving ability. ? Access support services to help you return to driving. These may include training and adaptive equipment.   General instructions  Keep a consistent daily routine.  Rest helps the brain to heal. Make sure you: ? Get plenty of sleep at night. Avoid staying up late. ? Keep the same bedtime hours on weekends and weekdays. ? Rest during the day. Take daytime naps or rest breaks when you feel tired.  Avoid extra stress on your eyes. You may need to set time limits when working on the computer, watching TV, and reading.  Make lists, set reminders, or use a day planner to help your memory.  Allow yourself plenty of time to complete everyday tasks, such as grocery shopping, paying bills, and doing laundry.  Focus on one task at a time. How to recognize and manage stress Certain activities and situations may  be more challenging to handle after a brain injury. This may cause you to feel stressed. Here are some signs that you may be feeling stressed:  Feeling irritable, angry, or frustrated.  Feeling worried or anxious.  Avoiding contact with other people.  Having a fast heart rate. Finding healthy ways to manage stress can help make a difficult situation easier to handle. This may include:  Avoiding activities that cause stress.  Deep breathing, yoga, or meditation.  Listening to music or spending time outdoors. Follow these instructions at home:  Take over-the-counter and prescription medicines only as told by your health care provider. Do not take aspirin or other anti-inflammatory medicines, such as ibuprofen or naproxen, unless your health care provider approves.  Avoid large amounts of caffeine. Your body may be more sensitive to it after your injury.  Do not use any products that contain nicotine or tobacco, such as cigarettes, e-cigarettes, and chewing tobacco. If you need help quitting, ask your health care provider.  Do not use drugs.  Do not use alcohol until your health care provider approves. Your body may be more sensitive to it after your injury. Alcohol may slow your recovery.  Do not drive until your health care provider says that it is safe.  Keep all follow-up visits as told by your health care provider. This is important. Where to find support  Talk with your employer, co-workers, teachers, or school counselor about your injury. Work together to develop a plan for completing tasks while you recover.  Talk to others living with a TBI. Join a support group with other people who have experienced a TBI.  Let your friends and family members know what they can do to help. This might include helping at home or with transportation to appointments.  If you are unable to continue working after your injury, talk to a Child psychotherapist about options to help you meet your  financial needs.  Find additional information and support by contacting the National Brain Injury Information Center [facility]) at [phone].  If you are a Community education officer, seek out additional resources, such as: ? Sports coach and [employer] Traumatic Brain Injury Center of Excellence: BeverageBargains.co.za ? Department of [employer]: (220)294-7500. Contact a health care provider if:  You have new or worsening: ? Dizziness. ? Headache. ? Anxiety or depression. ? Irritability. ? Confusion. ? Extreme sensitivity to  light or sound. ? Nausea or vomiting. Get help right away if:  You have jerky movements that you cannot control (seizures). Summary  A traumatic brain injury (TBI) is an injury to your brain that can interfere with vision, memory, concentration, reasoning, speech, balance, sense of touch, and sleep. TBI can also cause long-lasting (chronic) symptoms, such as headache or dizziness.  After a TBI, you may need to make several changes to your lifestyle in order to recover as well as possible. How quickly and how fully you recover will depend on the severity of your injury.  Rest helps the brain to heal. Make sure you get plenty of sleep at night. Avoid staying up late.  Talk to your family, friends, employer, co-workers, Architectural technologist, or school counselor about your injury. Work together to develop a plan for completing tasks while you recover.  Talk to others living with a TBI. Join a support group with other people who have experienced a TBI. This information is not intended to replace advice given to you by your health care provider. Make sure you discuss any questions you have with your health care provider. Document Revised: 07/21/2019 Document Reviewed: 07/21/2019 Elsevier Patient Education  2021 Elsevier Inc.  COVID-19: What to Do if You Are Sick If you have a fever, cough or other symptoms, you might have COVID-19.  Most people have mild illness and are able to recover at home. If you are sick:  Keep track of your symptoms.  If you have an emergency warning sign (including trouble breathing), call 911. Steps to help prevent the spread of COVID-19 if you are sick If you are sick with COVID-19 or think you might have COVID-19, follow the steps below to care for yourself and to help protect other people in your home and community. Stay home except to get medical care  Stay home. Most people with COVID-19 have mild illness and can recover at home without medical care. Do not leave your home, except to get medical care. Do not visit public areas.  Take care of yourself. Get rest and stay hydrated. Take over-the-counter medicines, such as acetaminophen, to help you feel better.  Stay in touch with your doctor. Call before you get medical care. Be sure to get care if you have trouble breathing, or have any other emergency warning signs, or if you think it is an emergency.  Avoid public transportation, ride-sharing, or taxis. Separate yourself from other people As much as possible, stay in a specific room and away from other people and pets in your home. If possible, you should use a separate bathroom. If you need to be around other people or animals in or outside of the home, wear a mask. Tell your close contactsthat they may have been exposed to COVID-19. An infected person can spread COVID-19 starting 48 hours (or 2 days) before the person has any symptoms or tests positive. By letting your close contacts know they may have been exposed to COVID-19, you are helping to protect everyone.  Additional guidance is available for those living in close quarters and shared housing.  See COVID-19 and Animals if you have questions about pets.  If you are diagnosed with COVID-19, someone from the health department may call you. Answer the call to slow the spread. Monitor your symptoms  Symptoms of COVID-19 include  fever, cough, or other symptoms.  Follow care instructions from your healthcare provider and local health department. Your local health authorities may give instructions on checking your  symptoms and reporting information. When to seek emergency medical attention Look for emergency warning signs* for COVID-19. If someone is showing any of these signs, seek emergency medical care immediately:  Trouble breathing  Persistent pain or pressure in the chest  New confusion  Inability to wake or stay awake  Pale, gray, or blue-colored skin, lips, or nail beds, depending on skin tone *This list is not all possible symptoms. Please call your medical provider for any other symptoms that are severe or concerning to you. Call 911 or call ahead to your local emergency facility: Notify the operator that you are seeking care for someone who has or may have COVID-19. Call ahead before visiting your doctor  Call ahead. Many medical visits for routine care are being postponed or done by phone or telemedicine.  If you have a medical appointment that cannot be postponed, call your doctor's office, and tell them you have or may have COVID-19. This will help the office protect themselves and other patients. Get  tested  If you have symptoms of COVID-19, get tested. While waiting for test results, you stay away from others, including staying apart from those living in your household.  You can visit your state, tribal, local, and territorialhealth department's website to look for the latest local information on testing sites. If you are sick, wear a mask over your nose and mouth  You should wear a mask over your nose and mouth if you must be around other people or animals, including pets (even at home).  You don't need to wear the mask if you are alone. If you can't put on a mask (because of trouble breathing, for example), cover your coughs and sneezes in some other way. Try to stay at least 6 feet away from  other people. This will help protect the people around you.  Masks should not be placed on young children under age 12 years, anyone who has trouble breathing, or anyone who is not able to remove the mask without help. Note: During the COVID-19 pandemic, medical grade facemasks are reserved for healthcare workers and some first responders. Cover your coughs and sneezes  Cover your mouth and nose with a tissue when you cough or sneeze.  Throw away used tissues in a lined trash can.  Immediately wash your hands with soap and water for at least 20 seconds. If soap and water are not available, clean your hands with an alcohol-based hand sanitizer that contains at least 60% alcohol. Clean your hands often  Wash your hands often with soap and water for at least 20 seconds. This is especially important after blowing your nose, coughing, or sneezing; going to the bathroom; and before eating or preparing food.  Use hand sanitizer if soap and water are not available. Use an alcohol-based hand sanitizer with at least 60% alcohol, covering all surfaces of your hands and rubbing them together until they feel dry.  Soap and water are the best option, especially if hands are visibly dirty.  Avoid touching your eyes, nose, and mouth with unwashed hands.  Handwashing Tips Avoid sharing personal household items  Do not share dishes, drinking glasses, cups, eating utensils, towels, or bedding with other people in your home.  Wash these items thoroughly after using them with soap and water or put in the dishwasher. Clean all "high-touch" surfaces everyday  Clean and disinfect high-touch surfaces in your "sick room" and bathroom; wear disposable gloves. Let someone else clean and disinfect surfaces in common areas,  but you should clean your bedroom and bathroom, if possible.  If a caregiver or other person needs to clean and disinfect a sick person's bedroom or bathroom, they should do so on an as-needed  basis. The caregiver/other person should wear a mask and disposable gloves prior to cleaning. They should wait as long as possible after the person who is sick has used the bathroom before coming in to clean and use the bathroom. ? High-touch surfaces include phones, remote controls, counters, tabletops, doorknobs, bathroom fixtures, toilets, keyboards, tablets, and bedside tables.  Clean and disinfect areas that may have blood, stool, or body fluids on them.  Use household cleaners and disinfectants. Clean the area or item with soap and water or another detergent if it is dirty. Then, use a household disinfectant. ? Be sure to follow the instructions on the label to ensure safe and effective use of the product. Many products recommend keeping the surface wet for several minutes to ensure germs are killed. Many also recommend precautions such as wearing gloves and making sure you have good ventilation during use of the product. ? Use a product from [employer] List N: Disinfectants for Coronavirus (COVID-19). ? Complete Disinfection Guidance When you can be around others after being sick with COVID-19 Deciding when you can be around others is different for different situations. Find out when you can safely end home isolation. For any additional questions about your care, contact your healthcare provider or state or local health department. 08/31/2019 Content source: [facility] [place] for Immunization and Respiratory Diseases ([facility]), Division of Viral Diseases This information is not intended to replace advice given to you by your health care provider. Make sure you discuss any questions you have with your health care provider. Document Revised: 04/16/2020 Document Reviewed: 04/16/2020 Elsevier Patient Education  2021 [employer].

## 2020-10-12 NOTE — Plan of Care (Signed)
  Problem: Education: Goal: Knowledge of General Education information will improve Description: Including pain rating scale, medication(s)/side effects and non-pharmacologic comfort measures Outcome: Progressing   Problem: Health Behavior/Discharge Planning: Goal: Ability to manage health-related needs will improve Outcome: Not Progressing   Problem: Clinical Measurements: Goal: Ability to maintain clinical measurements within normal limits will improve Outcome: Progressing Goal: Will remain free from infection Outcome: Progressing

## 2020-10-12 NOTE — Progress Notes (Signed)
Upon assessing patient, there were no IV sites on patient. Patient says they took it out and he doesn't want any needle because he is going home next day .
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
## 2020-10-12 NOTE — Progress Notes (Signed)
Inpatient Rehab Admissions Coordinator:   Note plans to d/c home with mom today.  CIR will sign off at this time.   Estill Dooms, PT, DPT Admissions Coordinator [phone] 10/12/20  12:33 PM

## 2020-10-12 NOTE — Plan of Care (Signed)
Patient does not want rehab and wants to go home today.

## 2020-10-20 ENCOUNTER — Emergency Department (HOSPITAL_COMMUNITY)
Admission: EM | Admit: 2020-10-20 | Discharge: 2020-10-21 | Disposition: A | Discharge status: Home / Self Care | Attending: Emergency Medicine | Admitting: Emergency Medicine

## 2020-10-20 ENCOUNTER — Emergency Department (HOSPITAL_COMMUNITY)

## 2020-10-20 ENCOUNTER — Other Ambulatory Visit: Payer: Self-pay

## 2020-10-20 ENCOUNTER — Encounter (HOSPITAL_COMMUNITY): Payer: Self-pay

## 2020-10-20 DIAGNOSIS — Z87820 Personal history of traumatic brain injury: Secondary | ICD-10-CM | POA: Insufficient documentation

## 2020-10-20 DIAGNOSIS — F17200 Nicotine dependence, unspecified, uncomplicated: Secondary | ICD-10-CM | POA: Insufficient documentation

## 2020-10-20 DIAGNOSIS — G249 Dystonia, unspecified: Secondary | ICD-10-CM | POA: Insufficient documentation

## 2020-10-20 DIAGNOSIS — M62830 Muscle spasm of back: Secondary | ICD-10-CM | POA: Diagnosis present

## 2020-10-20 HISTORY — DX: Unspecified intracranial injury with loss of consciousness of unspecified duration, initial encounter: S06.9X9A

## 2020-10-20 HISTORY — DX: Unspecified intracranial injury with loss of consciousness status unknown, initial encounter: S06.9XAA

## 2020-10-20 MED ORDER — DIAZEPAM 5 MG PO TABS
5.0000 mg | ORAL_TABLET | Freq: Once | ORAL | Status: AC
  Administered 2020-10-20: 5 mg via ORAL
  Filled 2020-10-20: qty 1
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
## 2020-10-20 NOTE — ED Notes (Signed)
Pt is in radiology at this time.  

## 2020-10-21 MED ORDER — METHOCARBAMOL 500 MG PO TABS: 500.0000 mg | ORAL_TABLET | Freq: Three times a day (TID) | ORAL | 0 refills | Status: DC | PRN

## 2020-10-21 MED ORDER — LIDOCAINE 5 % EX PTCH: 1.0000 | MEDICATED_PATCH | CUTANEOUS | Status: DC

## 2020-10-21 NOTE — ED Provider Notes (Signed)
Repeat examination reveals patient sleeping in the bed.  He easily awakens.  He reports that he is experiencing pain down around the tailbone.  Examination reveals diffuse tenderness to the area.  No upper back tenderness.  CT of head reviewed, no worsening of previously diagnosed subdural hematoma.  Patient with normal strength and sensation of the lower extremities, no saddle anesthesia, no foot drop.   Gilda Crease, MD 10/21/20 0200

## 2020-10-22 ENCOUNTER — Other Ambulatory Visit: Payer: Self-pay

## 2020-10-22 ENCOUNTER — Ambulatory Visit

## 2020-10-22 ENCOUNTER — Ambulatory Visit: Attending: General Surgery | Admitting: Occupational Therapy

## 2020-10-22 DIAGNOSIS — R41841 Cognitive communication deficit: Secondary | ICD-10-CM

## 2020-10-22 DIAGNOSIS — R41844 Frontal lobe and executive function deficit: Secondary | ICD-10-CM

## 2020-10-22 DIAGNOSIS — R41840 Attention and concentration deficit: Secondary | ICD-10-CM | POA: Diagnosis present
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
## 2020-10-30 ENCOUNTER — Ambulatory Visit: Admitting: Occupational Therapy

## 2020-10-30 ENCOUNTER — Ambulatory Visit

## 2020-11-01 ENCOUNTER — Ambulatory Visit

## 2020-11-01 ENCOUNTER — Ambulatory Visit: Admitting: Occupational Therapy

## 2020-11-06 ENCOUNTER — Ambulatory Visit

## 2020-11-06 ENCOUNTER — Ambulatory Visit: Admitting: Occupational Therapy

## 2020-11-08 ENCOUNTER — Ambulatory Visit

## 2020-11-08 ENCOUNTER — Ambulatory Visit: Admitting: Occupational Therapy

## 2020-11-13 ENCOUNTER — Ambulatory Visit: Admitting: Occupational Therapy

## 2020-11-13 ENCOUNTER — Ambulatory Visit

## 2020-11-15 ENCOUNTER — Ambulatory Visit: Admitting: Occupational Therapy

## 2020-11-15 ENCOUNTER — Telehealth: Payer: Self-pay

## 2020-11-15 ENCOUNTER — Ambulatory Visit

## 2020-11-15 NOTE — Telephone Encounter (Signed)
Called preferred number (mother) provided in the chart due to recent no-shows for both ST and OT. No answer, so SLP left voicemail. SLP provided information about clinic no-show policy. Informed that excess number of no-shows would result in remaining appointments being cancelled and pt/mom would need to schedule them one at a time from that point. SLP stated option to re-schedule remaining appointments for different times/dates if current scheduled times were no longer ideal for them. Recommended mother return phone call to front office staff re: scheduling.

## 2020-11-20 ENCOUNTER — Ambulatory Visit

## 2020-11-20 ENCOUNTER — Ambulatory Visit: Admitting: Occupational Therapy

## 2020-11-22 ENCOUNTER — Encounter: Admitting: Occupational Therapy

## 2020-11-22 ENCOUNTER — Encounter

## 2020-11-27 ENCOUNTER — Encounter

## 2020-11-28 ENCOUNTER — Ambulatory Visit: Payer: Self-pay | Admitting: Physician Assistant

## 2020-11-28 ENCOUNTER — Other Ambulatory Visit: Payer: Self-pay
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
## 2020-12-04 ENCOUNTER — Encounter

## 2020-12-04 NOTE — Therapy (Signed)
[facility] [address] [place], [place], [address] Phone: [phone]   Fax:  [phone]  Patient Details  Name: Brett Moreno MRN: [id number] Date of Birth: Oct 16, 1995 Referring Provider:  Saverio Danker, PA-C  Encounter Date: 12/04/2020  SPEECH THERAPY DISCHARGE SUMMARY  Visits from Start of Care: 1  Current functional level related to goals / functional outcomes: Kylie did not return to OPST intervention targeting cognitive communication deficits after initial evaluation. Pt discharged secondary to multiple no-shows and no return phone calls to re-schedule appointments.    Remaining deficits: Baseline autism, attention deficits, verbosity    Education / Equipment: Evaluation results and recommendations    Patient agrees to discharge. Patient goals were not met. Patient is being discharged due to not returning since the last visit.         SLP Short Term Goals - 10/22/20 1655                SLP SHORT TERM GOAL #1     Title pt will demonstrate sustained attention adequate for WNL conversation for 60 seconds (adequate topic maintenance) with listener rare nonverbal cues in 3 sessions     Time 4     Period Weeks     Status New           SLP SHORT TERM GOAL #2     Title pt will demonstrate sustained attention for 90 seconds to complete a pertinent written/keyboard language task in 3 sessions     Time 4     Period Weeks     Status New           SLP SHORT TERM GOAL #3     Title pt will complete formal cognitive linguistic assessment     Time 2     Period Weeks     Status New                       SLP Long Term Goals - 10/22/20 1658                SLP LONG TERM GOAL #1     Title pt will demonstrate sustained/selective attention for 2 minutes to complete a pertinent written/keyboard language task with rare nonverbal cues in 3 sessions     Time 8     Period Weeks   or 17 visits for all LTGs      Status New           SLP LONG TERM GOAL #2     Title pt will demonstrate sustained/selective attention adequate for [facility] conversation for 90 seconds, in 3 sessions     Time [place], [place] CCC-SLP 12/04/2020, 10:15 AM  [facility] [address] [place], [place], [address] Phone: [phone]   Fax:  [phone]

## 2020-12-11 ENCOUNTER — Encounter

## 2020-12-18 ENCOUNTER — Encounter

## 2021-01-02 ENCOUNTER — Ambulatory Visit: Payer: Self-pay | Attending: Physician Assistant | Admitting: Physician Assistant

## 2021-01-02 ENCOUNTER — Other Ambulatory Visit: Payer: Self-pay

## 2021-01-02 DIAGNOSIS — S065XAA Traumatic subdural hemorrhage with loss of consciousness status unknown, initial encounter: Secondary | ICD-10-CM

## 2021-01-02 DIAGNOSIS — S065X9A Traumatic subdural hemorrhage with loss of consciousness of unspecified duration, initial encounter: Secondary | ICD-10-CM

## 2021-01-02 DIAGNOSIS — Z09 Encounter for follow-up examination after completed treatment for conditions other than malignant neoplasm: Secondary | ICD-10-CM
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
## 2021-03-04 ENCOUNTER — Ambulatory Visit: Payer: Self-pay | Admitting: Nurse Practitioner

## 2021-06-11 ENCOUNTER — Ambulatory Visit: Payer: Self-pay | Admitting: Neurology

## 2021-07-04 ENCOUNTER — Emergency Department (HOSPITAL_COMMUNITY)
Admission: EM | Admit: 2021-07-04 | Discharge: 2021-07-05 | Disposition: A | Discharge status: Home / Self Care | Payer: Self-pay | Attending: Emergency Medicine | Admitting: Emergency Medicine

## 2021-07-04 ENCOUNTER — Encounter (HOSPITAL_COMMUNITY): Payer: Self-pay

## 2021-07-04 DIAGNOSIS — F1290 Cannabis use, unspecified, uncomplicated: Secondary | ICD-10-CM | POA: Insufficient documentation

## 2021-07-04 DIAGNOSIS — R5383 Other fatigue: Secondary | ICD-10-CM | POA: Insufficient documentation

## 2021-07-04 DIAGNOSIS — F1210 Cannabis abuse, uncomplicated: Secondary | ICD-10-CM

## 2021-07-04 DIAGNOSIS — R4182 Altered mental status, unspecified: Secondary | ICD-10-CM | POA: Insufficient documentation

## 2021-07-04 DIAGNOSIS — Y906 Blood alcohol level of 120-199 mg/100 ml: Secondary | ICD-10-CM | POA: Insufficient documentation

## 2021-07-04 LAB — CBC WITH DIFFERENTIAL/PLATELET
Abs Immature Granulocytes: 0.04 10*3/uL (ref 0.00–0.07)
Basophils Absolute: 0 10*3/uL (ref 0.0–0.1)
Basophils Relative: 0 %
Eosinophils Absolute: 0.1 10*3/uL (ref 0.0–0.5)
Eosinophils Relative: 1 %
HCT: 41.7 % (ref 39.0–52.0)
Hemoglobin: 13.9 g/dL (ref 13.0–17.0)
Immature Granulocytes: 0 %
Lymphocytes Relative: 13 %
Lymphs Abs: 1.3 10*3/uL (ref 0.7–4.0)
MCH: 30 pg (ref 26.0–34.0)
MCHC: 33.3 g/dL (ref 30.0–36.0)
MCV: 89.9 fL (ref 80.0–100.0)
Monocytes Absolute: 0.6 10*3/uL (ref 0.1–1.0)
Monocytes Relative: 6 %
Neutro Abs: 8.3 10*3/uL — ABNORMAL HIGH (ref 1.7–7.7)
Neutrophils Relative %: 80 %
Platelets: 261 10*3/uL (ref 150–400)
RBC: 4.64 MIL/uL (ref 4.22–5.81)
RDW: 11.9 % (ref 11.5–15.5)
WBC: 10.4 10*3/uL (ref 4.0–10.5)
nRBC: 0 % (ref 0.0–0.2)

## 2021-07-04 LAB — COMPREHENSIVE METABOLIC PANEL
ALT: 16 U/L (ref 0–44)
AST: 23 U/L (ref 15–41)
Albumin: 5 g/dL (ref 3.5–5.0)
Alkaline Phosphatase: 47 U/L (ref 38–126)
Anion gap: 11 (ref 5–15)
BUN: 10 mg/dL (ref 6–20)
CO2: 24 mmol/L (ref 22–32)
Calcium: 9.1 mg/dL (ref 8.9–10.3)
Chloride: 105 mmol/L (ref 98–111)
Creatinine, Ser: 0.98 mg/dL (ref 0.61–1.24)
GFR, Estimated: >60 mL/min (ref >60)
Glucose, Bld: 93 mg/dL (ref 70–99)
Potassium: 3.7 mmol/L (ref 3.5–5.1)
Sodium: 140 mmol/L (ref 135–145)
Total Bilirubin: 0.7 mg/dL (ref 0.3–1.2)
Total Protein: 7.9 g/dL (ref 6.5–8.1)

## 2021-07-04 LAB — ETHANOL: Alcohol, Ethyl (B): 154 mg/dL — ABNORMAL HIGH (ref <10)
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
## 2021-07-05 LAB — RAPID URINE DRUG SCREEN, HOSP PERFORMED
Amphetamines: NOT DETECTED
Barbiturates: NOT DETECTED
Benzodiazepines: NOT DETECTED
Cocaine: NOT DETECTED
Opiates: NOT DETECTED
Tetrahydrocannabinol: POSITIVE — AB
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
## 2021-09-22 ENCOUNTER — Encounter (HOSPITAL_COMMUNITY): Payer: Self-pay | Admitting: Emergency Medicine

## 2021-09-22 ENCOUNTER — Other Ambulatory Visit: Payer: Self-pay

## 2021-09-22 ENCOUNTER — Emergency Department (HOSPITAL_COMMUNITY)
Admission: EM | Admit: 2021-09-22 | Discharge: 2021-09-22 | Disposition: A | Discharge status: Home / Self Care | Payer: Medicaid Other | Attending: Emergency Medicine | Admitting: Emergency Medicine

## 2021-09-22 ENCOUNTER — Emergency Department (HOSPITAL_COMMUNITY): Payer: Medicaid Other

## 2021-09-22 DIAGNOSIS — R197 Diarrhea, unspecified: Secondary | ICD-10-CM | POA: Insufficient documentation

## 2021-09-22 DIAGNOSIS — J209 Acute bronchitis, unspecified: Secondary | ICD-10-CM | POA: Insufficient documentation

## 2021-09-22 DIAGNOSIS — R109 Unspecified abdominal pain: Secondary | ICD-10-CM | POA: Insufficient documentation

## 2021-09-22 DIAGNOSIS — Z20822 Contact with and (suspected) exposure to covid-19: Secondary | ICD-10-CM | POA: Insufficient documentation

## 2021-09-22 DIAGNOSIS — R059 Cough, unspecified: Secondary | ICD-10-CM

## 2021-09-22 LAB — RESP PANEL BY RT-PCR (FLU A&B, COVID) ARPGX2
Influenza A by PCR: NEGATIVE
Influenza B by PCR: NEGATIVE
SARS Coronavirus 2 by RT PCR: NEGATIVE

## 2021-09-22 MED ORDER — FLUTICASONE PROPIONATE 50 MCG/ACT NA SUSP: 2.0000 | Freq: Every day | NASAL | 0 refills | Status: AC

## 2021-09-22 MED ORDER — FLUTICASONE PROPIONATE 50 MCG/ACT NA SUSP: 2.0000 | Freq: Every day | NASAL | 0 refills | Status: DC

## 2021-09-22 MED ORDER — BENZONATATE 100 MG PO CAPS: 100.0000 mg | ORAL_CAPSULE | Freq: Three times a day (TID) | ORAL | 0 refills | Status: DC

## 2021-09-22 MED ORDER — CETIRIZINE HCL 10 MG PO TABS: 10.0000 mg | ORAL_TABLET | Freq: Every day | ORAL | 0 refills | Status: AC

## 2021-09-22 NOTE — ED Provider Notes (Signed)
?[facility]-EMERGENCY DEPT ?Provider Note ? ? ?CSN: [id number] ?Arrival date & time: 09/22/21  1521 ? ?  ? ?History ? ?Chief Complaint  ?Patient presents with  ? Cough  ? ? ?[facility] is a 26 y.o. male with chief complaint of cough and congestion since a mold exposure at work 2 weeks ago.  Pt mostly uncooperative throughout history and exam.  Describes cough as productive.  Sputum is white in color.  Denies shortness of breath, chest pain, fever, or neck stiffness.  Denies ear or throat pain.  Denies difficulty swallowing.  Denies urinary symptoms.  Began having abdominal tenderness and diarrhea today.  Unable to characterize further.  Denies hx of allergies, pneumonia, or respiratory disease. ? ?The history is provided by the patient and medical records.  ?Cough ? ?  ? ?Home Medications ?Prior to Admission medications   ?Medication Sig Start Date End Date Taking? Authorizing Provider  ?benzonatate (TESSALON) 100 MG capsule Take 1 capsule (100 mg total) by mouth every 8 (eight) hours. 09/22/21  Yes Cecil Cobbs, PA-C  ?cetirizine (ZYRTEC ALLERGY) 10 MG tablet Take 1 tablet (10 mg total) by mouth daily for 14 days. 09/22/21 10/06/21 Yes Cecil Cobbs, PA-C  ?fluticasone (FLONASE) 50 MCG/ACT nasal spray Place 2 sprays into both nostrils daily. 09/22/21  Yes Cecil Cobbs, PA-C  ?   ? ?Allergies    ?Pork-derived products   ? ?Review of Systems   ?Review of Systems  ?Respiratory:  Positive for cough.   ?Gastrointestinal:  Positive for abdominal pain (Mild) and diarrhea.  ? ?Physical Exam ?Updated Vital Signs ?BP 128/82   Pulse 74   Temp (!) 97.5 ?F (36.4 ?C) (Oral)   Resp 18   SpO2 99%  ?Physical Exam ?Vitals and nursing note reviewed.  ?Constitutional:   ?   General: He is awake. He is not in acute distress. ?   Appearance: He is well-developed. He is not ill-appearing, toxic-appearing or diaphoretic.  ?HENT:  ?   Head: Normocephalic and atraumatic.  ?   Comments: Unable to  assess ear canals, nose, and mouth due to pt's level of cooperation ?   Right Ear: External ear normal.  ?   Left Ear: External ear normal.  ?   Nose: No nasal deformity or laceration.  ?   Mouth/Throat:  ?   Mouth: Mucous membranes are moist.  ?   Pharynx: Oropharynx is clear.  ?Eyes:  ?   Conjunctiva/sclera: Conjunctivae normal.  ?Cardiovascular:  ?   Rate and Rhythm: Normal rate and regular rhythm.  ?   Pulses: Normal pulses.  ?   Heart sounds: Normal heart sounds. No murmur heard. ?Pulmonary:  ?   Effort: Pulmonary effort is normal. No respiratory distress.  ?   Comments: Unable to fully assess breath sounds due to pt's level of cooperation ?Appears to be breathing without difficulty or increased effort ?No audible wheeze or appreciated cough ?Abdominal:  ?   Palpations: Abdomen is soft.  ?   Tenderness: There is abdominal tenderness.  ?   Comments: Unable to fully assess abdomen due to pt's level of cooperation  ?Musculoskeletal:     ?   General: No swelling.  ?   Cervical back: Neck supple.  ?Skin: ?   General: Skin is warm and dry.  ?   Capillary Refill: Capillary refill takes less than 2 seconds.  ?   Coloration: Skin is not jaundiced or pale.  ?Neurological:  ?  Mental Status: He is alert and oriented to person, place, and time.  ?Psychiatric:     ?   Mood and Affect: Mood normal.     ?   Behavior: Behavior is uncooperative.  ? ? ?ED Results / Procedures / Treatments   ?Labs ?(all labs ordered are listed, but only abnormal results are displayed) ?Labs Reviewed  ?RESP PANEL BY RT-PCR (FLU A&B, COVID) [id number]  ?BASIC METABOLIC PANEL  ?CBC  ? ? ?EKG ?None ? ?Radiology ?DG Chest 2 View ? ?Result Date: 09/22/2021 ?CLINICAL DATA:  Persistent cough.  Mold exposure. EXAM: CHEST - 2 VIEW COMPARISON:  Chest x-ray dated 10/10/2020. FINDINGS: Heart size and mediastinal contours are within normal limits. At least mild thickening of the bilateral perihilar bronchovascular markings, with peribronchial cuffing, suggesting an  acute bronchitis. No confluent opacity to suggest a consolidating pneumonia. No pleural effusion or pneumothorax is seen. Osseous structures about the chest are unremarkable. IMPRESSION: 1. Findings suggest acute bronchitis. 2. No evidence of consolidating pneumonia. Electronically Signed   By: Bary Richard M.D.   On: 09/22/2021 16:27   ? ?Procedures ?Procedures  ? ? ?Medications Ordered in ED ?Medications - No data to display ? ?ED Course/ Medical Decision Making/ A&P ?  ?                        ?Medical Decision Making ? ?Pt presenting today with complaints of productive cough, abdominal tenderness, and diarrhea.  Pt overall uncooperative with history and physical exam.  No known history of seasonal allergies.  Pt refused bloodwork.  Pt CXR suggestive of acute bronchitis, and negative for pneumothorax or pneumonia.  No hx of chronic bronchitis.  Pts symptoms are consistent with URI, likely viral etiology.  Covid and Flu results still pending.  Discussed that antibiotics are not indicated for viral infections.  Pt will be discharged with symptomatic treatment.  Pt understands he is outside the 7 day quarantine window and treatment window of covid or flu from onset of symptoms, as his have been going on for 14 days.  Discussed that he is eligible to return to work starting tomorrow and to utilize symptom management.  Verbalizes understanding and is agreeable with plan.  Pt is hemodynamically stable & in NAD prior to dc.  ? ?I discussed the patient and their case with my attending, Dr. Renaye Rakers, who agreed with the proposed treatment course.  After consideration of the diagnostic results and the patients response to treatment, I feel that the patent would benefit from outpatient symptomatic treatment.  May follow up with PCP for re-evaluation in 1 week.  Discussed course of treatment thoroughly with the patient and he demonstrated understanding.  Patient in agreement and has no further questions. ? ?This chart was  dictated using voice recognition software.  Despite best efforts to proofread,  errors can occur which can change the documentation meaning. ? ? ? ? ? ? ? ? ?Final Clinical Impression(s) / ED Diagnoses ?Final diagnoses:  ?Cough, unspecified type  ?Acute bronchitis, unspecified organism  ? ? ?Rx / DC Orders ?ED Discharge Orders   ? ?      Ordered  ?  cetirizine (ZYRTEC ALLERGY) 10 MG tablet  Daily       ? 09/22/21 1632  ?  fluticasone (FLONASE) 50 MCG/ACT nasal spray  Daily       ? 09/22/21 1632  ?  benzonatate (TESSALON) 100 MG capsule  Every 8 hours       ?  09/22/21 1637  ? ?  ?  ? ?  ? ? ?  ?Cecil CobbsCockerham, Yashua Bracco M, PA-C ?09/22/21 1659 ? ?  ?Terald Sleeperrifan, Matthew J, MD ?09/22/21 1717 ? ?

## 2021-09-22 NOTE — ED Notes (Signed)
Pt refused blood work.  EDP aware ? ?

## 2021-09-22 NOTE — Discharge Instructions (Addendum)
Three prescriptions have been sent to your pharmacy to help manage symptoms. They are as follows:  ?Flonase: spray once in each nostril, daily to help your congestion ?Zyrtec: take one tablet daily for 14 days to help your congestion ?Benzonatate: take one capsule every 8 hours for 10 days to help your cough ? ?Also stay hydrated by drinking plenty of fluids, including tea with honey for your cough.   ? ?Follow-up with your primary care within the next 1 week or so for reevaluation as needed. ? ?Return to the ED for new or worsening symptoms as discussed. ?

## 2021-09-22 NOTE — ED Triage Notes (Signed)
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  Afterward began having a cough. Believes he has "a level 2 exposure" and requests evaluation.  VSS.  ?

## 2021-09-24 ENCOUNTER — Other Ambulatory Visit: Payer: Self-pay

## 2021-09-24 ENCOUNTER — Emergency Department (HOSPITAL_COMMUNITY): Payer: Medicaid Other

## 2021-09-24 ENCOUNTER — Encounter (HOSPITAL_COMMUNITY): Payer: Self-pay

## 2021-09-24 ENCOUNTER — Emergency Department (HOSPITAL_COMMUNITY)
Admission: EM | Admit: 2021-09-24 | Discharge: 2021-09-24 | Disposition: A | Discharge status: Home / Self Care | Payer: Medicaid Other | Attending: Emergency Medicine | Admitting: Emergency Medicine

## 2021-09-24 DIAGNOSIS — R569 Unspecified convulsions: Secondary | ICD-10-CM | POA: Insufficient documentation

## 2021-09-24 LAB — CBC WITH DIFFERENTIAL/PLATELET
Abs Immature Granulocytes: 0.01 10*3/uL (ref 0.00–0.07)
Basophils Absolute: 0 10*3/uL (ref 0.0–0.1)
Basophils Relative: 1 %
Eosinophils Absolute: 0.1 10*3/uL (ref 0.0–0.5)
Eosinophils Relative: 1 %
HCT: 39.3 % (ref 39.0–52.0)
Hemoglobin: 13.2 g/dL (ref 13.0–17.0)
Immature Granulocytes: 0 %
Lymphocytes Relative: 30 %
Lymphs Abs: 1.6 10*3/uL (ref 0.7–4.0)
MCH: 30.8 pg (ref 26.0–34.0)
MCHC: 33.6 g/dL (ref 30.0–36.0)
MCV: 91.8 fL (ref 80.0–100.0)
Monocytes Absolute: 0.3 10*3/uL (ref 0.1–1.0)
Monocytes Relative: 6 %
Neutro Abs: 3.3 10*3/uL (ref 1.7–7.7)
Neutrophils Relative %: 62 %
Platelets: 251 10*3/uL (ref 150–400)
RBC: 4.28 MIL/uL (ref 4.22–5.81)
RDW: 11.8 % (ref 11.5–15.5)
WBC: 5.3 10*3/uL (ref 4.0–10.5)
nRBC: 0 % (ref 0.0–0.2)

## 2021-09-24 LAB — ETHANOL: Alcohol, Ethyl (B): <10 mg/dL (ref <10)

## 2021-09-24 LAB — COMPREHENSIVE METABOLIC PANEL
ALT: 15 U/L (ref 0–44)
AST: 21 U/L (ref 15–41)
Albumin: 4 g/dL (ref 3.5–5.0)
Alkaline Phosphatase: 49 U/L (ref 38–126)
Anion gap: 8 (ref 5–15)
BUN: 11 mg/dL (ref 6–20)
CO2: 26 mmol/L (ref 22–32)
Calcium: 9.3 mg/dL (ref 8.9–10.3)
Chloride: 105 mmol/L (ref 98–111)
Creatinine, Ser: 0.93 mg/dL (ref 0.61–1.24)
GFR, Estimated: >60 mL/min (ref >60)
Glucose, Bld: 86 mg/dL (ref 70–99)
Potassium: 4 mmol/L (ref 3.5–5.1)
Sodium: 139 mmol/L (ref 135–145)
Total Bilirubin: 0.6 mg/dL (ref 0.3–1.2)
Total Protein: 7.1 g/dL (ref 6.5–8.1)

## 2021-09-24 LAB — RAPID URINE DRUG SCREEN, HOSP PERFORMED
Amphetamines: NOT DETECTED
Barbiturates: NOT DETECTED
Benzodiazepines: NOT DETECTED
Cocaine: NOT DETECTED
Opiates: NOT DETECTED
Tetrahydrocannabinol: POSITIVE — AB

## 2021-09-24 LAB — MAGNESIUM: Magnesium: 2 mg/dL (ref 1.7–2.4)

## 2021-09-24 LAB — LACTIC ACID, PLASMA: Lactic Acid, Venous: 1.6 mmol/L (ref 0.5–1.9)

## 2021-09-24 LAB — CK: Total CK: 375 U/L (ref 49–397)

## 2021-09-24 MED ORDER — LEVETIRACETAM 500 MG PO TABS: 500.0000 mg | ORAL_TABLET | Freq: Two times a day (BID) | ORAL | 0 refills | Status: DC

## 2021-09-24 MED ORDER — LEVETIRACETAM IN NACL 1000 MG/100ML IV SOLN
1000.0000 mg | Freq: Once | INTRAVENOUS | Status: AC
  Administered 2021-09-24: 1000 mg via INTRAVENOUS
  Filled 2021-09-24: qty 100

## 2021-09-24 NOTE — Care Management (Signed)
ED CM consulted concerning  assistance with a PCP.  Patient needs to see a neurologist but does not have health insurance.  Patient will have to arrange to follow up with a PCP prior.  Discussed the [facility] Clinic, information  is placed on the AVS.  ED RNCM will  forward  patient information to Clinic scheduler.  ?

## 2021-09-24 NOTE — ED Provider Notes (Signed)
?[facility] ?Provider Note ? ? ?CSN: [id number] ?Arrival date & time: 09/24/21  1530 ? ?  ? ?History ? ?Chief Complaint  ?Patient presents with  ? Seizures  ? ?LEVEL 5 CAVEAT - POST ICTAL STATE ? ?[facility] is a 26 y.o. male with PMHx TBI s/p craniotomy who presents to the ED today via EMS for seizure.  Per EMS report patient coming from the [facility] for seizure.  Bystanders stated patient was seizing, full body activity for approximately 4 minutes.  Upon EMS arrival patient postictal.  Continues to be postictal on arrival to the ED.  Was not provided any medications for same.  Family at bedside, reports that he has had a couple of seizures since being diagnosed with TBI last year.  They believe he was seen by neurology however does not not believe he is on any medications for same.  ? ?Per chart review patient was last seen by neurosurgery in November of last year, there was a referral placed for neurology and patient was prescribed Keppra 500 mg twice daily however family does not believe he ever started taking this medications.  It is not on his medication list at this time. ? ?Patient unable to provide additional information at this time given postictal state.  ? ?The history is provided by the patient, the EMS personnel, medical records and a parent.  ? ?  ? ?Home Medications ?Prior to Admission medications   ?Medication Sig Start Date End Date Taking? Authorizing Provider  ?levETIRAcetam (KEPPRA) 500 MG tablet Take 1 tablet (500 mg total) by mouth 2 (two) times daily. 09/24/21 10/24/21 Yes Tryniti Laatsch, PA-C  ?benzonatate (TESSALON) 100 MG capsule Take 1 capsule (100 mg total) by mouth every 8 (eight) hours. 09/22/21   Cecil Cobbs, PA-C  ?cetirizine (ZYRTEC ALLERGY) 10 MG tablet Take 1 tablet (10 mg total) by mouth daily for 14 days. 09/22/21 10/06/21  Cecil Cobbs, PA-C  ?fluticasone (FLONASE) 50 MCG/ACT nasal spray Place 2 sprays into both nostrils  daily. 09/22/21   Cecil Cobbs, PA-C  ?   ? ?Allergies    ?Pork-derived products   ? ?Review of Systems   ?Review of Systems  ?Unable to perform ROS: Patient nonverbal  ?Neurological:  Positive for seizures.  ? ?Physical Exam ?Updated Vital Signs ?BP 117/83   Pulse 62   Temp 98.2 ?F (36.8 ?C) (Oral)   Resp 16   Ht 6' (1.829 m)   Wt 72.6 kg   SpO2 98%   BMI 21.70 kg/m?  ?Physical Exam ?Vitals and nursing note reviewed.  ?Constitutional:   ?   Appearance: He is not ill-appearing.  ?   Comments: Post ictal state. Alert, spontaneously moving eyes and looking around room. Unable to verbalize at this time. Moving extremities. Not following commands.   ?HENT:  ?   Head: Normocephalic and atraumatic.  ?Eyes:  ?   Conjunctiva/sclera: Conjunctivae normal.  ?Cardiovascular:  ?   Rate and Rhythm: Normal rate and regular rhythm.  ?Pulmonary:  ?   Effort: Pulmonary effort is normal.  ?   Breath sounds: Normal breath sounds. No wheezing, rhonchi or rales.  ?Abdominal:  ?   Palpations: Abdomen is soft.  ?   Tenderness: There is no abdominal tenderness.  ?Musculoskeletal:  ?   Cervical back: Neck supple.  ?Skin: ?   General: Skin is warm and dry.  ?Neurological:  ?   Mental Status: He is alert.  ? ? ?ED  Results / Procedures / Treatments   ?Labs ?(all labs ordered are listed, but only abnormal results are displayed) ?Labs Reviewed  ?RAPID URINE DRUG SCREEN, HOSP PERFORMED - Abnormal; Notable for the following components:  ?    Result Value  ? Tetrahydrocannabinol POSITIVE (*)   ? All other components within normal limits  ?COMPREHENSIVE METABOLIC PANEL  ?CBC WITH DIFFERENTIAL/PLATELET  ?ETHANOL  ?MAGNESIUM  ?CK  ?LACTIC ACID, PLASMA  ?LACTIC ACID, PLASMA  ?CBG MONITORING, ED  ? ? ?EKG ?EKG Interpretation ? ?Date/Time:  Tuesday September 24 2021 16:06:56 EDT ?Ventricular Rate:  60 ?PR Interval:  126 ?QRS Duration: 92 ?QT Interval:  426 ?QTC Calculation: 426 ?R Axis:   84 ?Text Interpretation: Sinus rhythm Ventricular premature  complex ST elev, probable normal early repol pattern? -?  no sig change from Jan 2023 ecg Confirmed by Alvester Chou [phone]) on 09/24/2021 5:02:45 PM ? ?Radiology ?CT HEAD WO CONTRAST ? ?Result Date: 09/24/2021 ?CLINICAL DATA:  Witnessed new tonic clonic seizure for 4 minutes EXAM: CT HEAD WITHOUT CONTRAST TECHNIQUE: Contiguous axial images were obtained from the base of the skull through the vertex without intravenous contrast. RADIATION DOSE REDUCTION: This exam was performed according to the departmental dose-optimization program which includes automated exposure control, adjustment of the mA and/or kV according to patient size and/or use of iterative reconstruction technique. COMPARISON:  10/20/2020 FINDINGS: Brain: Normal ventricular morphology. No midline shift or mass effect. Normal appearance of brain parenchyma. No intracranial hemorrhage, mass lesion, or evidence of acute infarction. Minimal subdural hematoma and dural thickening overlying RIGHT hemisphere on prior exam resolved. Vascular: No hyperdense vessels Skull: Prior RIGHT craniotomy frontoparietal temporal. Remaining calvaria intact Sinuses/Orbits: Mucosal thickening and small amount of fluid in LEFT sphenoid sinus. Remaining paranasal sinuses and mastoid air cells clear. Other: N/A IMPRESSION: Prior RIGHT craniotomy with resolution of postoperative changes seen on previous study. No acute intracranial abnormalities. Electronically Signed   By: Ulyses Southward M.D.   On: 09/24/2021 16:34   ? ?Procedures ?Procedures  ? ? ?Medications Ordered in ED ?Medications  ?levETIRAcetam (KEPPRA) IVPB 1000 mg/100 mL premix (0 mg Intravenous Stopped 09/24/21 1651)  ? ? ?ED Course/ Medical Decision Making/ A&P ?Clinical Course as of 09/24/21 2044  ?Tue Sep 24, 2021  ?1607 This is a 26 year old with a history of traumatic brain injury presenting to the emergency department with a seizure.  His parents report the patient had a seizure while outside at his house today.   Supplemental history of out of his mother father at bedside.  Per my review of his medical chart he was prescribed Keppra by his neurosurgeon, but his parents report he does not take this medication.  On my exam the patient's vitals are within normal limits.  He is awake, would not verbalize, is slowly lifting both arms and staring at his hands.  I do not see persistent seizure-like activity.  We will continue to monitor as his mental status improves.  Per my review of his prior ED visits, the patient is admitted to heavy and frequent marijuana use, which may be a trigger for seizures.  He is also noncompliant with his Keppra.  We will give him some Keppra here and check his labs and a repeat CT scan, as it is not clear whether he experienced trauma from the fall (no visible head deformities) [MT]  ?  ?Clinical Course User Index ?[MT] Terald Sleeper, MD  ? ?                        ?  Medical Decision Making ?[age] male with a history of TBI status post craniotomy with history of a couple of seizures since that time, not currently on any medications who presents to the ED today for seizure-like activity for approximately 4 minutes.  On arrival patient postictal, unable to provide additional history.  Family at bedside.  Patient not on any medications for same.  Vitals are stable on arrival.  Patient is postictal, not verbalizing at this time however moving his eyes spontaneously and looking around the room.  Able to move his hands and legs however not currently following commands.  We will plan to work-up for seizure-like activity at this time with labs.  UDS added as patient has history of marijuana abuse in the past.  EtOH added for same.  We will plan for CT head for further evaluation.  Will load with Keppra. Attending physician Dr. Renaye Rakersrifan evaluated patient as well and agrees with plan.  ? ?Workup overall reassuring without acute findings. UDS positive for THC. On reevaluation pt appears less post ictal however  continues to not speak. Family had reported earlier that he is normally verbal. He is able to use his hands to communicate his needs and shows that he would like something to drink by using his hands to form a c

## 2021-09-24 NOTE — ED Triage Notes (Signed)
[facility] reports pt coming from [employer] for seizure. Bystanders state pt was seizing full body for about 4 minutes. Upon EMS arrival pt has been postictal the entire time. History of TBI one year ago. ?

## 2021-09-24 NOTE — Discharge Instructions (Addendum)
Please call [facility] tomorrow to schedule an appointment to establish care for primary care needs. They can refer you to a neurologist given you do not have insurance currently.  ? ?Pick up medication and take as prescribed DAILY to help prevent seizures.  ? ?Per [facility] you should not drive for the next 6 months until you can be cleared by a neurologist from a seizure standpoint.  ? ?To apply for Medicaid ?[facility] ?[address], [place], [place] [address]  ?[phone] ?Or online: ?[email or web address].servicenowservices.com/ ? ? ? ? ?

## 2021-09-24 NOTE — ED Notes (Signed)
2nd lactic not needed per EDP 

## 2021-09-24 NOTE — ED Notes (Signed)
Pt provided with water and crackers and educated to let the RN know if he is able to tolerate it well  ?

## 2021-11-06 ENCOUNTER — Emergency Department (HOSPITAL_COMMUNITY)
Admission: EM | Admit: 2021-11-06 | Discharge: 2021-11-06 | Disposition: A | Discharge status: Home / Self Care | Payer: Medicaid Other | Attending: Emergency Medicine | Admitting: Emergency Medicine

## 2021-11-06 DIAGNOSIS — F840 Autistic disorder: Secondary | ICD-10-CM | POA: Insufficient documentation

## 2021-11-06 DIAGNOSIS — R569 Unspecified convulsions: Secondary | ICD-10-CM | POA: Insufficient documentation

## 2021-11-06 LAB — CBC WITH DIFFERENTIAL/PLATELET
Abs Immature Granulocytes: 0.01 10*3/uL (ref 0.00–0.07)
Basophils Absolute: 0 10*3/uL (ref 0.0–0.1)
Basophils Relative: 0 %
Eosinophils Absolute: 0.2 10*3/uL (ref 0.0–0.5)
Eosinophils Relative: 2 %
HCT: 39.1 % (ref 39.0–52.0)
Hemoglobin: 13.3 g/dL (ref 13.0–17.0)
Immature Granulocytes: 0 %
Lymphocytes Relative: 28 %
Lymphs Abs: 1.9 10*3/uL (ref 0.7–4.0)
MCH: 30.9 pg (ref 26.0–34.0)
MCHC: 34 g/dL (ref 30.0–36.0)
MCV: 90.9 fL (ref 80.0–100.0)
Monocytes Absolute: 0.4 10*3/uL (ref 0.1–1.0)
Monocytes Relative: 7 %
Neutro Abs: 4.1 10*3/uL (ref 1.7–7.7)
Neutrophils Relative %: 63 %
Platelets: 232 10*3/uL (ref 150–400)
RBC: 4.3 MIL/uL (ref 4.22–5.81)
RDW: 11.3 % — ABNORMAL LOW (ref 11.5–15.5)
WBC: 6.6 10*3/uL (ref 4.0–10.5)
nRBC: 0 % (ref 0.0–0.2)

## 2021-11-06 LAB — ETHANOL: Alcohol, Ethyl (B): <10 mg/dL (ref <10)

## 2021-11-06 LAB — BASIC METABOLIC PANEL
Anion gap: 8 (ref 5–15)
BUN: 12 mg/dL (ref 6–20)
CO2: 24 mmol/L (ref 22–32)
Calcium: 9.2 mg/dL (ref 8.9–10.3)
Chloride: 107 mmol/L (ref 98–111)
Creatinine, Ser: 0.97 mg/dL (ref 0.61–1.24)
GFR, Estimated: >60 mL/min (ref >60)
Glucose, Bld: 97 mg/dL (ref 70–99)
Potassium: 4.1 mmol/L (ref 3.5–5.1)
Sodium: 139 mmol/L (ref 135–145)

## 2021-11-06 MED ORDER — LEVETIRACETAM 500 MG PO TABS: 500.0000 mg | ORAL_TABLET | Freq: Two times a day (BID) | ORAL | 0 refills | Status: AC

## 2021-11-06 MED ORDER — LEVETIRACETAM IN NACL 1000 MG/100ML IV SOLN
1000.0000 mg | Freq: Once | INTRAVENOUS | Status: AC
  Administered 2021-11-06: 1000 mg via INTRAVENOUS
  Filled 2021-11-06: qty 100

## 2021-11-06 NOTE — ED Notes (Signed)
Patient verbalizes understanding of discharge instructions. Opportunity for questioning and answers were provided. Armband removed by staff, pt discharged from ED ambulatory.   

## 2021-11-06 NOTE — ED Notes (Signed)
Pt non verbal at this time but alert - pt is able to track with eyes but no movement in extremities at this time
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
## 2021-11-06 NOTE — ED Notes (Signed)
Pt mother contacted and said father will be picking him up

## 2021-11-06 NOTE — ED Notes (Signed)
MD Preston Fleeting notified of pt rhythm and HR on monitor - pt intermittently bradycardic

## 2021-11-06 NOTE — ED Notes (Signed)
Pt still lethargic, unable to provide urine sample at this time

## 2021-11-06 NOTE — Discharge Instructions (Signed)
It is very important that you take the levetiracetam (Keppra) every day, twice a day.  This medicine is to prevent seizures.  If you do not take it, you will continue to have seizures.

## 2021-11-06 NOTE — ED Provider Notes (Signed)
[facility] Provider Note   CSN: [id number] Arrival date & time: 11/06/21  0008     History  Chief Complaint  Patient presents with   Seizures    [facility] is a 26 y.o. male.  The history is provided by the EMS personnel. The history is limited by the condition of the patient (Patient nonverbal).  Seizures He has history of traumatic brain injury, seizures and apparently had a seizure at home.  Patient is not talking to me but will nod his head yes or no to some questions.  EMS reports family heard him fall in the bathroom and reported seizures lasting 3-4 minutes.  No mention of bowel or bladder incontinence or bit lip or tongue.  EMS reports family told them that he is usually out of it for several hours following for seizures, also history of autism.  Patient admits to not taking his anticonvulsant medication.  He also denies recent drug use.   Home Medications Prior to Admission medications   Medication Sig Start Date End Date Taking? Authorizing Provider  benzonatate (TESSALON) 100 MG capsule Take 1 capsule (100 mg total) by mouth every 8 (eight) hours. [id number]   Prince Rome, PA-C  cetirizine (ZYRTEC ALLERGY) 10 MG tablet Take 1 tablet (10 mg total) by mouth daily for 14 days. 09/22/21 [id number]  Prince Rome, PA-C  fluticasone (FLONASE) 50 MCG/ACT nasal spray Place 2 sprays into both nostrils daily. [id number]   Prince Rome, PA-C  levETIRAcetam (KEPPRA) 500 MG tablet Take 1 tablet (500 mg total) by mouth 2 (two) times daily. 09/24/21 10/24/21  Eustaquio Maize, PA-C      Allergies    Pork-derived products    Review of Systems   Review of Systems  Unable to perform ROS: Patient nonverbal  Neurological:  Positive for seizures.   Physical Exam Updated Vital Signs BP (!) 133/91   Pulse 60   Temp 98.3 F (36.8 C) (Axillary)   Resp 16   Ht 6' (1.829 m)   Wt 73 kg   SpO2 100%   BMI 21.83 kg/m  Physical  Exam Vitals and nursing note reviewed.  26 year old male, resting comfortably and in no acute distress. Vital signs are significant for borderline elevated blood pressure. Oxygen saturation is 100%, which is normal. Head is normocephalic and atraumatic. PERRLA, EOMI. Oropharynx is clear. Neck is nontender without adenopathy or JVD. Back is nontender and there is no CVA tenderness. Lungs are clear without rales, wheezes, or rhonchi. Chest is nontender. Heart has regular rate and rhythm without murmur. Abdomen is soft, flat, nontender without masses or hepatosplenomegaly and peristalsis is normoactive. Extremities have no cyanosis or edema, full range of motion is present. Skin is warm and dry without rash. Neurologic: Awake and alert but nonverbal, cranial nerves are intact, moves all extremities equally.  ED Results / Procedures / Treatments   Labs (all labs ordered are listed, but only abnormal results are displayed) Labs Reviewed  CBC WITH DIFFERENTIAL/PLATELET - Abnormal; Notable for the following components:      Result Value   RDW 11.3 (*)    All other components within normal limits  BASIC METABOLIC PANEL  ETHANOL  RAPID URINE DRUG SCREEN, HOSP PERFORMED    EKG EKG Interpretation  Date/Time:  Wednesday Nov 06 2021 00:15:12 EDT Ventricular Rate:  63 PR Interval:  154 QRS Duration: 95 QT Interval:  429 QTC Calculation: 440 R Axis:   82 Text Interpretation:  Sinus arrhythmia Ventricular premature complex Probable left ventricular hypertrophy ST elev, probable normal early repol pattern When compared with ECG of 09/24/2021, No significant change was found Confirmed by Delora Fuel ([id number]) on 11/06/2021 12:20:00 AM  Procedures Procedures    Medications Ordered in ED Medications  levETIRAcetam (KEPPRA) IVPB 1000 mg/100 mL premix (has no administration in time range)    ED Course/ Medical Decision Making/ A&P                           Medical Decision Making Amount  and/or Complexity of Data Reviewed Labs: ordered.  Risk Prescription drug management.   Seizure in patient with known history of seizure disorder and apparently noncompliant with anticonvulsant therapy.  Old records are reviewed, and he had been seen in the ED for seizure on 09/24/2021 at which time he had admitted to medication noncompliance and heavy marijuana use.  Hospitalization on 10/06/2020 for traumatic subdural hematoma, discharge summary states that he was supposed to be on levetiracetam for 7-days for seizure prophylaxis.  Prescription for levetiracetam was given after ED visit on 09/24/2021.  He is given a loading dose of levetiracetam here, we will check screening labs and anticipate discharge with prescription for levetiracetam and neurology follow-up.  I have independently viewed and interpreted the ECG, and it is unchanged from prior.  I have independently viewed and interpreted the laboratory testing which is normal including ethanol level which is not detectable.  Patient has been observed in the emergency department, he is now awake and conversant.  He is discharged with prescription for levetiracetam and I am recommending neurology follow-up.  Patient states that he is unable to afford this.  Importance of taking anticonvulsants was stressed.  Have also spoken with his mother to stress the importance of compliance with anticonvulsants.  Final Clinical Impression(s) / ED Diagnoses Final diagnoses:  Seizure [facility])    Rx / DC Orders ED Discharge Orders          Ordered    levETIRAcetam (KEPPRA) 500 MG tablet  2 times daily        11/06/21 [id number]              Delora Fuel, MD [id number] [phone]

## 2021-11-06 NOTE — ED Notes (Signed)
MD Glick at bedside. 

## 2021-11-06 NOTE — ED Notes (Signed)
Pt awake and verbal now - says he just wants to be left alone. Urinal at bedside

## 2022-02-06 IMAGING — CT CT HEAD W/O CM
4 series · 16 of 47 positions shown, 18 images · non-contrast
Comparison: 10/10/2020, 10/06/2020

CLINICAL DATA: History of traumatic brain injury and recent
subdural hematoma on 10/06/2020

EXAM:
CT HEAD WITHOUT CONTRAST
TECHNIQUE: Contiguous axial images were obtained from the base of the skull
through the vertex without intravenous contrast.

[Series 3: head without · axial · non-contrast · 0.42mm/px · z∈[-144,-24]mm · 7 of 34 slices shown, 9 images]
[im 5/34  brain]
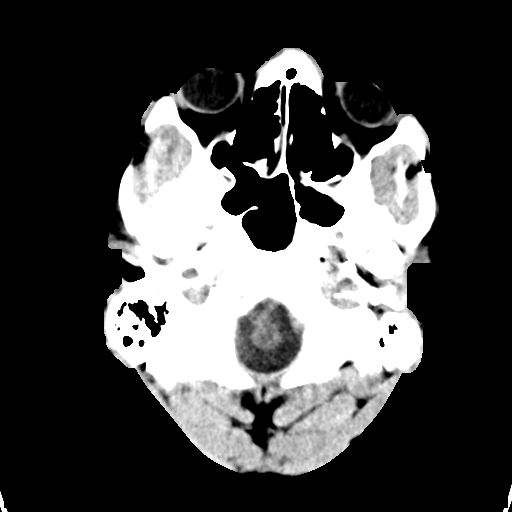
[im 5/34  bone]
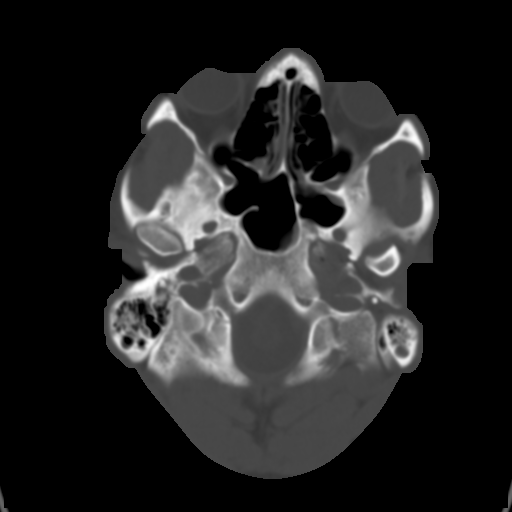
[im 9/34  brain]
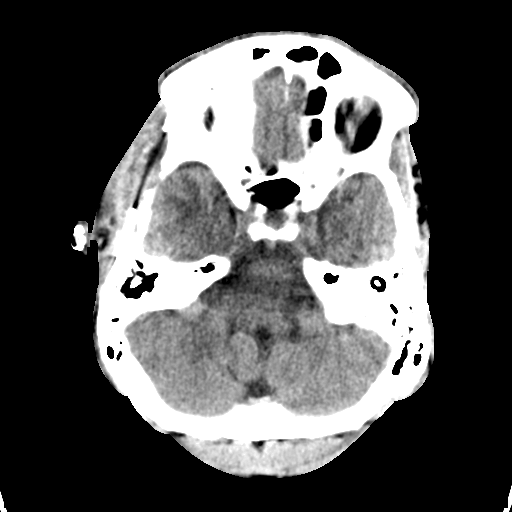
[im 13/34  brain]
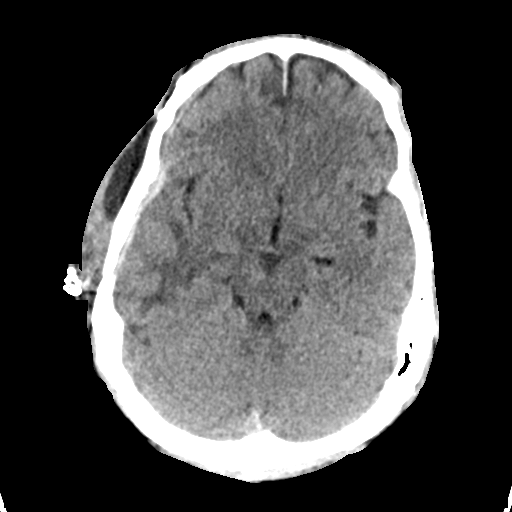
[im 17/34  brain]
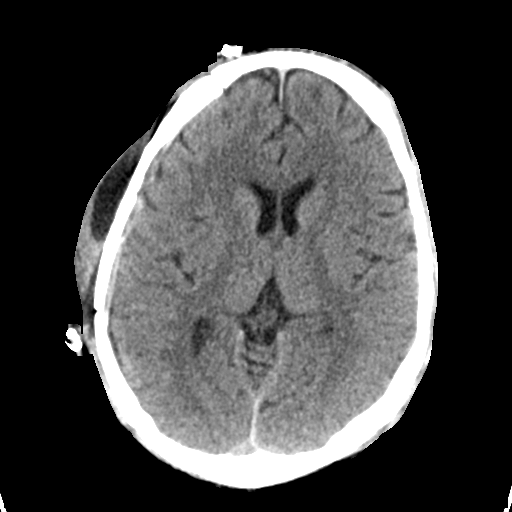
[im 21/34  brain]
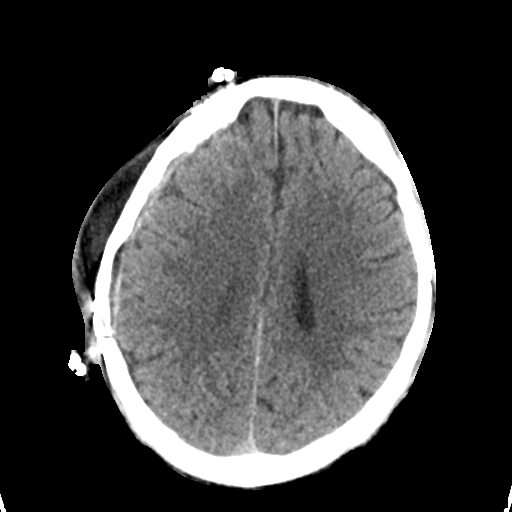
[im 21/34  bone]
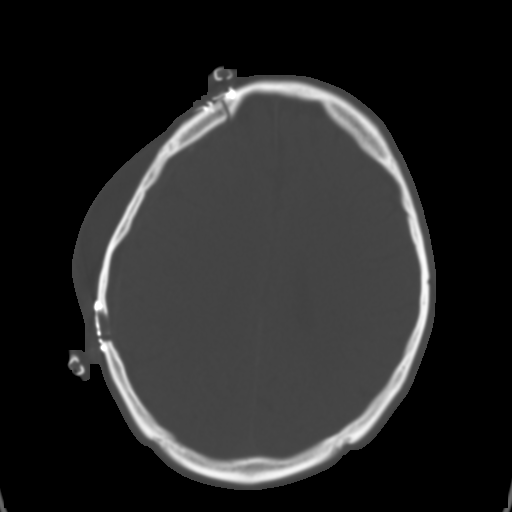
[im 25/34  brain]
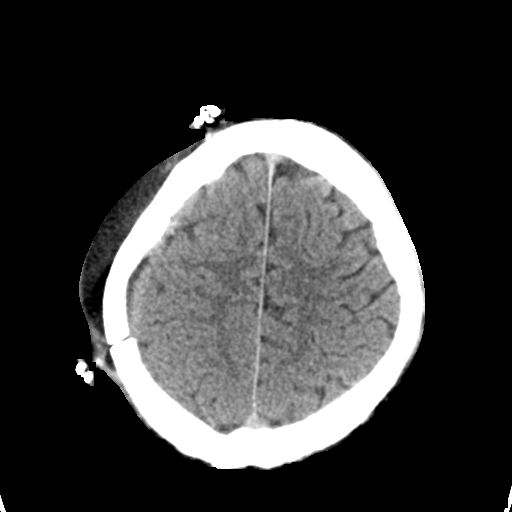
[im 29/34  brain]
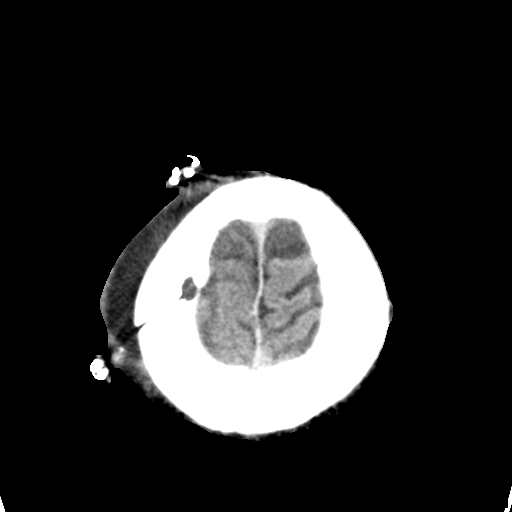

[Series 4: head bone · axial · 0.42mm/px · z∈[-148,-114]mm · 3 of 85 slices shown]
[im 9/85  bone]
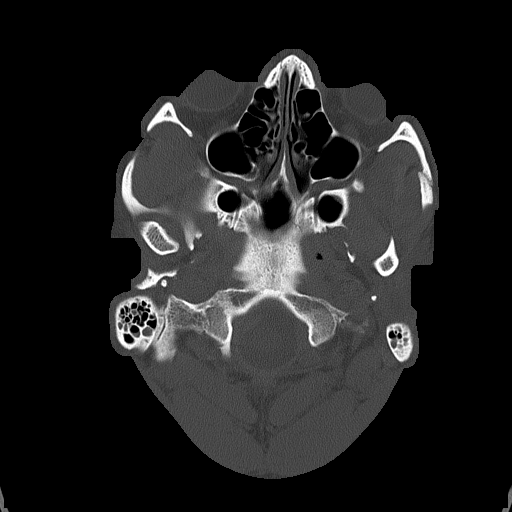
[im 17/85  bone]
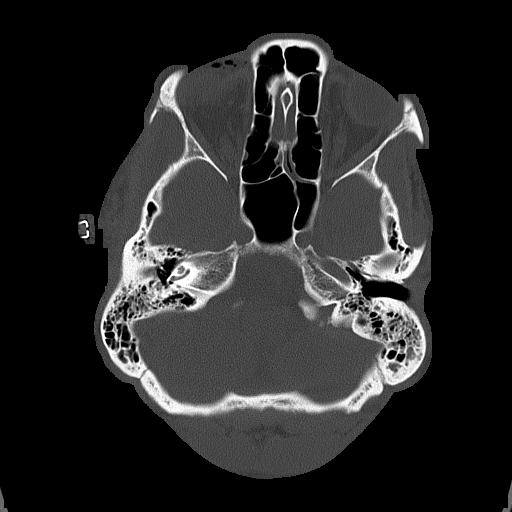
[im 26/85  bone]
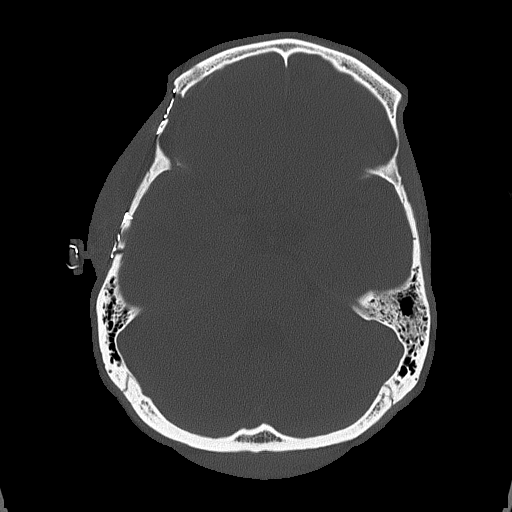

[Series 5: head without cor · coronal · non-contrast · 0.34mm/px · 3 of 70 slices shown]
[im 24/70  brain]
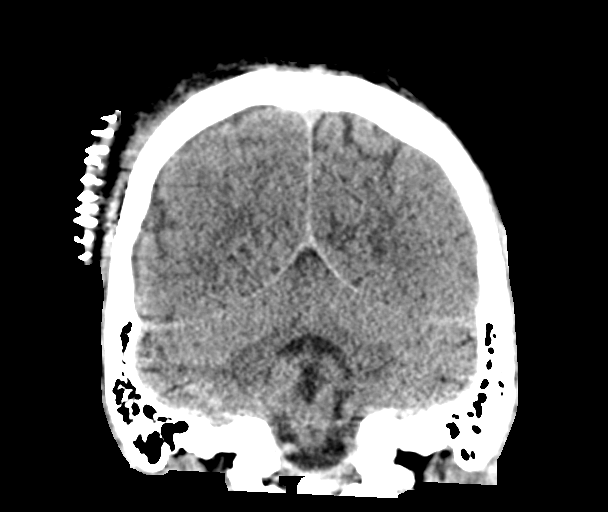
[im 31/70  brain]
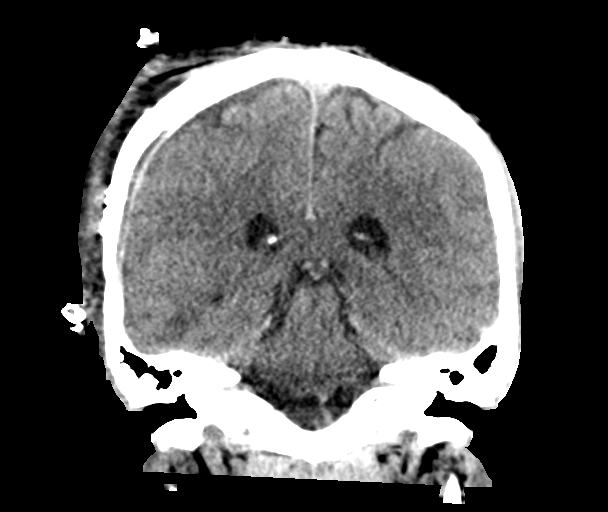
[im 39/70  brain]
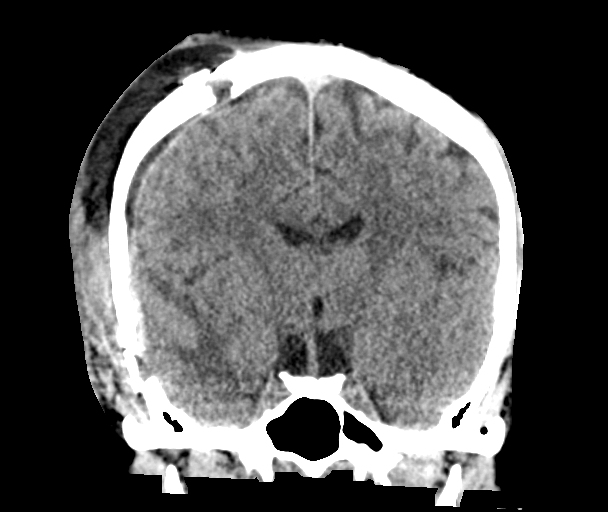

[Series 6: head without sag · sagittal · non-contrast · 0.33mm/px · 3 of 67 slices shown]
[im 23/67  brain]
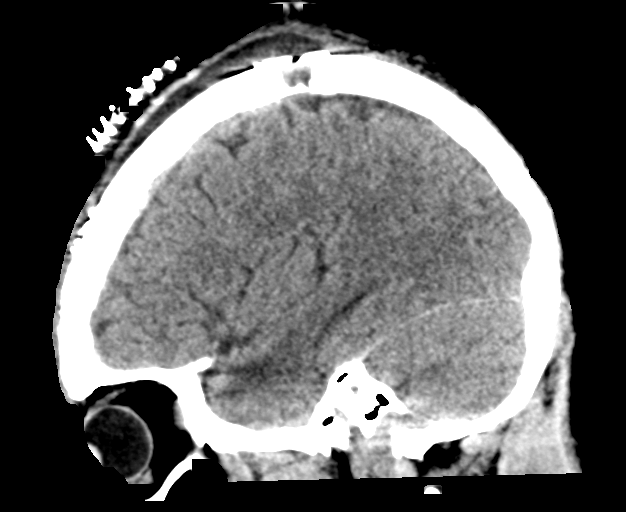
[im 34/67  brain]
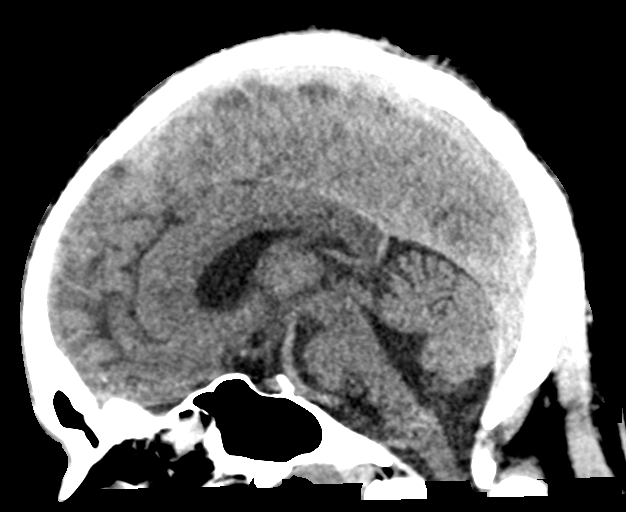
[im 45/67  brain]
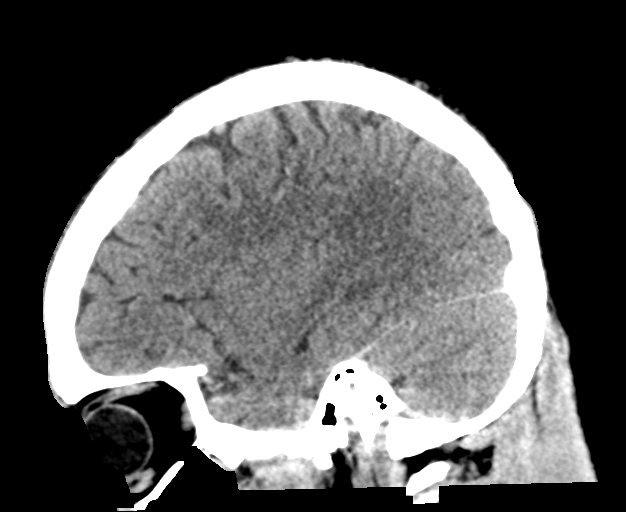

[16 of 47 positions shown; findings below may reference images not displayed]

FINDINGS: Brain: Changes of prior craniotomy are noted on the right related to
the known previous subdural hematoma. Multiple surgical staples are
noted. There is a fluid collection identified which measures
approximately 8.0 x 1.2 cm along the surgical bed likely
representing a postoperative seroma. Residual subdural fluid is
noted but continued improvement is seen when compared with the most
recent exam. Mild edema is again seen in the right temporal lobe
stable in appearance from the prior exam. No midline shift is noted.
No recurrent hematoma is seen.

Vascular: No hyperdense vessel or unexpected calcification.

Skull: Prior surgical defect is noted.

Sinuses/Orbits: No acute finding.

Other: None
IMPRESSION: Improving right subdural hematoma when compared with the prior exam
of 10/10/2020. Postsurgical changes are again noted. There is a new
ellipsoid fluid attenuation lesion identified beneath the scalp
lying upon the bony surgical site consistent with a postoperative
seroma. This has increased in the interval from the prior exam. No
new intracranial abnormality is noted.

## 2022-03-26 ENCOUNTER — Encounter (HOSPITAL_COMMUNITY): Payer: Self-pay | Admitting: Emergency Medicine

## 2022-03-26 ENCOUNTER — Other Ambulatory Visit: Payer: Self-pay

## 2022-03-26 ENCOUNTER — Emergency Department (HOSPITAL_COMMUNITY)
Admission: EM | Admit: 2022-03-26 | Discharge: 2022-03-27 | Disposition: A | Discharge status: Home / Self Care | Payer: Medicaid Other | Attending: Emergency Medicine | Admitting: Emergency Medicine

## 2022-03-26 DIAGNOSIS — S71132A Puncture wound without foreign body, left thigh, initial encounter: Secondary | ICD-10-CM | POA: Insufficient documentation

## 2022-03-26 DIAGNOSIS — W540XXA Bitten by dog, initial encounter: Secondary | ICD-10-CM | POA: Insufficient documentation

## 2022-03-26 DIAGNOSIS — Z23 Encounter for immunization: Secondary | ICD-10-CM | POA: Insufficient documentation

## 2022-03-26 NOTE — ED Triage Notes (Addendum)
Pt states that he was bit by a dog on his scrotum and then jumped by a couple of people. Pt has lacerations to left inner thigh. Bleeding controlled at this time.

## 2022-03-27 MED ORDER — TETANUS-DIPHTH-ACELL PERTUSSIS 5-2.5-18.5 LF-MCG/0.5 IM SUSY
0.5000 mL | PREFILLED_SYRINGE | Freq: Once | INTRAMUSCULAR | Status: AC
  Administered 2022-03-27: 0.5 mL via INTRAMUSCULAR
  Filled 2022-03-27: qty 0.5

## 2022-03-27 MED ORDER — IBUPROFEN 600 MG PO TABS: 600.0000 mg | ORAL_TABLET | Freq: Four times a day (QID) | ORAL | 0 refills | Status: AC | PRN

## 2022-03-27 MED ORDER — AMOXICILLIN-POT CLAVULANATE 875-125 MG PO TABS: 1.0000 | ORAL_TABLET | Freq: Two times a day (BID) | ORAL | 0 refills | Status: DC

## 2022-03-27 MED ORDER — AMOXICILLIN-POT CLAVULANATE 875-125 MG PO TABS
1.0000 | ORAL_TABLET | Freq: Once | ORAL | Status: AC
  Administered 2022-03-27: 1 via ORAL
  Filled 2022-03-27: qty 1

## 2022-03-27 MED ORDER — RABIES IMMUNE GLOBULIN 150 UNIT/ML IM INJ
20.0000 [IU]/kg | INJECTION | Freq: Once | INTRAMUSCULAR | Status: AC
  Administered 2022-03-27: 1425 [IU] via INTRAMUSCULAR
  Filled 2022-03-27 (×2): qty 10

## 2022-03-27 MED ORDER — RABIES VACCINE, PCEC IM SUSR
1.0000 mL | Freq: Once | INTRAMUSCULAR | Status: AC
  Administered 2022-03-27: 1 mL via INTRAMUSCULAR
  Filled 2022-03-27 (×2): qty 1

## 2022-03-27 NOTE — ED Notes (Signed)
Patient called [place] office after attack, he states that the animal was unknown and he does not know if it had it's vaccines

## 2022-03-27 NOTE — ED Provider Notes (Signed)
[facility]-EMERGENCY DEPT [facility] Emergency Department Provider Note MRN:  [id number]  Arrival date & time: 03/27/22     Chief Complaint   Animal Bite   History of Present Illness   Brett Moreno is a 26 y.o. year-old male presents to the ED with chief complaint of dog bite.  He was bitten on the left and right inner thighs.  He sustained a puncture wound to the left inner thigh. It is the dog of a "bum".  He states that it is not going to be possible to track down the dog.  He is uncertain of the dog's immunization status.  Patient denies any allergies.  History provided by patient.   Review of Systems  Pertinent positive and negative review of systems noted in HPI.    Physical Exam   Vitals:   03/26/22 2340  BP: (!) 143/85  Pulse: 93  Resp: 16  Temp: 97.9 F (36.6 C)  SpO2: 95%    CONSTITUTIONAL:  patient-appearing, NAD NEURO:  Alert and oriented x 3, CN 3-12 grossly intact EYES:  eyes equal and reactive ENT/NECK:  Supple, no stridor  CARDIO:  normal rate, appears well-perfused  PULM:  No respiratory distress,  GI/GU:  non-distended,  MSK/SPINE:  No gross deformities, no edema, moves all extremities  SKIN:  no rash, puncture wound to left inner thigh, none gaping, bleeding controlled, no visible FB   *Additional and/or pertinent findings included in MDM below  Diagnostic and Interventional Summary     Labs Reviewed - No data to display  No orders to display    Medications  amoxicillin-clavulanate (AUGMENTIN) 875-125 MG per tablet 1 tablet (1 tablet Oral Given 03/27/22 0109)  rabies immune globulin (HYPERRAB/KEDRAB) injection 1,425 Units (1,425 Units Intramuscular Given 03/27/22 0102)  rabies vaccine (RABAVERT) injection 1 mL (1 mL Intramuscular Given 03/27/22 0058)  Tdap (BOOSTRIX) injection 0.5 mL (0.5 mLs Intramuscular Given 03/27/22 0109)     Procedures  /  Critical Care Procedures  ED Course and Medical Decision Making  I have reviewed the  triage vital signs, the nursing notes, and pertinent available records from the EMR.  Social Determinants Affecting Complexity of Care: Patient has no clinically significant social determinants affecting this chief complaint..   ED Course:    Medical Decision Making Patient here after being bitten by dog.  It is an unknown dog, and the patient states that he will be unable to find out the dog's immunization status.    The actual dog bite is a 1 cm puncture wound to the left inner thigh without foreign body, no bleeding.  There is tenderness to palpation.  There is also an abrasion to the right inner thigh.  We will proceed with rabies series as patient says that it is the dog of "bum" and that he will not be able to have animal control monitor the dog, nor does he know the vaccination status.  We will update tetanus.  We will have nursing irrigate the wound.  We will need to heal by secondary intention.  Will start on Augmentin.  Risk Prescription drug management.     Consultants: No consultations were needed in caring for this patient.   Treatment and Plan: Emergency department workup does not suggest an emergent condition requiring admission or immediate intervention beyond  what has been performed at this time. The patient is safe for discharge and has  been instructed to return immediately for worsening symptoms, change in  symptoms or any other concerns  Final Clinical Impressions(s) / ED Diagnoses     ICD-10-CM   1. Dog bite, initial encounter  [email or web address]       ED Discharge Orders          Ordered    amoxicillin-clavulanate (AUGMENTIN) 875-125 MG tablet  Every 12 hours        03/27/22 0041    ibuprofen (ADVIL) 600 MG tablet  Every 6 hours PRN        03/27/22 0041              Discharge Instructions Discussed with and Provided to Patient:     Discharge Instructions      Take medications as instructed.  You need 3 additional shots in the future for  rabies prophylaxis.   Please return on:  10/15 10/19 10/26    To complete your rabies vaccination series.       Montine Circle, PA-C 03/27/22 1443    Merryl Hacker, MD 03/27/22 [phone]

## 2022-03-27 NOTE — Discharge Instructions (Signed)
Take medications as instructed.  You need 3 additional shots in the future for rabies prophylaxis.   Please return on:  10/15 10/19 10/26    To complete your rabies vaccination series.

## 2022-03-30 ENCOUNTER — Encounter (HOSPITAL_COMMUNITY): Payer: Self-pay | Admitting: *Deleted

## 2022-03-30 ENCOUNTER — Emergency Department (HOSPITAL_COMMUNITY)
Admission: EM | Admit: 2022-03-30 | Discharge: 2022-03-30 | Disposition: A | Discharge status: Home / Self Care | Payer: Medicaid Other | Attending: Emergency Medicine | Admitting: Emergency Medicine

## 2022-03-30 ENCOUNTER — Emergency Department (HOSPITAL_COMMUNITY)
Admission: EM | Admit: 2022-03-30 | Discharge: 2022-03-30 | Discharge status: Against Medical Advice | Payer: Medicaid Other

## 2022-03-30 ENCOUNTER — Other Ambulatory Visit: Payer: Self-pay

## 2022-03-30 DIAGNOSIS — Z203 Contact with and (suspected) exposure to rabies: Secondary | ICD-10-CM | POA: Insufficient documentation

## 2022-03-30 DIAGNOSIS — Z23 Encounter for immunization: Secondary | ICD-10-CM

## 2022-03-30 MED ORDER — RABIES VACCINE, PCEC IM SUSR
1.0000 mL | Freq: Once | INTRAMUSCULAR | Status: AC
  Administered 2022-03-30: 1 mL via INTRAMUSCULAR
  Filled 2022-03-30: qty 1

## 2022-03-30 NOTE — ED Triage Notes (Signed)
Pt returns for his Rabies shot, he was biten on 03/26/22

## 2022-03-30 NOTE — ED Provider Notes (Signed)
[facility]-EMERGENCY DEPT Provider Note   CSN: [id number] Arrival date & time: 03/30/22  0749     History  Chief Complaint  Patient presents with   Rabies Injection    Brett Moreno is a 26 y.o. male with medical history of TBI.  Patient presents to the ED for second rabies vaccination.  Patient reports that on 10/11 he was bit on his left and right inner thighs with a sustained puncture wound to the left inner thigh.  The patient reports that the dog belongs to an individual who he is not close with, there is no way to know if this dog is unvaccinated.  Patient returns today for second vaccination series.  Patient denies any medical complaints.  HPI     Home Medications Prior to Admission medications   Medication Sig Start Date End Date Taking? Authorizing Provider  amoxicillin-clavulanate (AUGMENTIN) 875-125 MG tablet Take 1 tablet by mouth every 12 (twelve) hours. 03/27/22   Roxy Horseman, PA-C  cetirizine (ZYRTEC ALLERGY) 10 MG tablet Take 1 tablet (10 mg total) by mouth daily for 14 days. 09/22/21 10/06/21  Cecil Cobbs, PA-C  fluticasone (FLONASE) 50 MCG/ACT nasal spray Place 2 sprays into both nostrils daily. 09/22/21   Cecil Cobbs, PA-C  ibuprofen (ADVIL) 600 MG tablet Take 1 tablet (600 mg total) by mouth every 6 (six) hours as needed. 03/27/22   Roxy Horseman, PA-C  levETIRAcetam (KEPPRA) 500 MG tablet Take 1 tablet (500 mg total) by mouth 2 (two) times daily. 11/06/21 12/06/21  Dione Booze, MD      Allergies    Pork-derived products    Review of Systems   Review of Systems  Skin:  Positive for wound.  All other systems reviewed and are negative.   Physical Exam Updated Vital Signs BP 123/76 (BP Location: Left Arm)   Pulse 77   Temp 98.1 F (36.7 C) (Oral)   Resp 18   Ht 6' (1.829 m)   SpO2 99%   BMI 21.83 kg/m  Physical Exam Vitals and nursing note reviewed.  Constitutional:      General: He is not in acute  distress.    Appearance: He is not ill-appearing, toxic-appearing or diaphoretic.  HENT:     Head: Normocephalic and atraumatic.     Nose: Nose normal. No congestion.     Mouth/Throat:     Mouth: Mucous membranes are moist.     Pharynx: Oropharynx is clear.  Eyes:     Extraocular Movements: Extraocular movements intact.     Conjunctiva/sclera: Conjunctivae normal.     Pupils: Pupils are equal, round, and reactive to light.  Cardiovascular:     Rate and Rhythm: Normal rate and regular rhythm.  Pulmonary:     Effort: Pulmonary effort is normal.     Breath sounds: Normal breath sounds. No wheezing.  Abdominal:     General: Abdomen is flat. Bowel sounds are normal.     Palpations: Abdomen is soft.     Tenderness: There is no abdominal tenderness.  Musculoskeletal:     Cervical back: Normal range of motion and neck supple. No tenderness.  Skin:    General: Skin is warm and dry.     Capillary Refill: Capillary refill takes less than 2 seconds.     Comments: Well-appearing puncture wound to left inner thigh.  No signs of infection.  Neurological:     Mental Status: He is alert and oriented to person, place, and time.  ED Results / Procedures / Treatments   Labs (all labs ordered are listed, but only abnormal results are displayed) Labs Reviewed - No data to display  EKG None  Radiology No results found.  Procedures Procedures   Medications Ordered in ED Medications  rabies vaccine (RABAVERT) injection 1 mL (has no administration in time range)    ED Course/ Medical Decision Making/ A&P                           Medical Decision Making Risk Prescription drug management.   26 year old male presents to ED for evaluation.  Please see HPI for further details.  On examination patient is afebrile, nontachycardic.  Patient lung sounds are clear bilaterally, he is not hypoxic.  Patient abdomen soft and compressible throughout.  Patient puncture wound to left thigh is  well-appearing, no signs of infection.  Patient has no medical complaints.  Second vaccination series provided.  Patient advised to return on 10/18 for third vaccination series.  Patient voices understanding.  Patient stable for discharge.  Final Clinical Impression(s) / ED Diagnoses Final diagnoses:  Need for rabies vaccination    Rx / DC Orders ED Discharge Orders     None         Azucena Cecil, PA-C 03/30/22 0820    Elgie Congo, MD 03/30/22 [phone]

## 2022-03-30 NOTE — ED Notes (Signed)
Pt was seen walking out and getting into car.

## 2022-03-30 NOTE — Discharge Instructions (Addendum)
Please return to ED for third vaccination series on 10/18.

## 2022-04-02 ENCOUNTER — Other Ambulatory Visit: Payer: Self-pay

## 2022-04-02 ENCOUNTER — Emergency Department (HOSPITAL_COMMUNITY)
Admission: EM | Admit: 2022-04-02 | Discharge: 2022-04-02 | Disposition: A | Discharge status: Home / Self Care | Payer: Medicaid Other | Attending: Emergency Medicine | Admitting: Emergency Medicine

## 2022-04-02 ENCOUNTER — Encounter (HOSPITAL_COMMUNITY): Payer: Self-pay

## 2022-04-02 DIAGNOSIS — Z23 Encounter for immunization: Secondary | ICD-10-CM | POA: Insufficient documentation

## 2022-04-02 DIAGNOSIS — Z2914 Encounter for prophylactic rabies immune globin: Secondary | ICD-10-CM | POA: Insufficient documentation

## 2022-04-02 MED ORDER — RABIES VACCINE, PCEC IM SUSR
1.0000 mL | Freq: Once | INTRAMUSCULAR | Status: AC
Start: 2022-04-02 — End: 2022-04-02
  Administered 2022-04-02: 1 mL via INTRAMUSCULAR
  Filled 2022-04-02: qty 1

## 2022-04-02 MED ORDER — AMOXICILLIN-POT CLAVULANATE 875-125 MG PO TABS: 1.0000 | ORAL_TABLET | Freq: Two times a day (BID) | ORAL | 0 refills | Status: AC

## 2022-04-02 NOTE — ED Triage Notes (Signed)
Patient needs 2nd rabies shot. Bit on 10/11.

## 2022-04-02 NOTE — Discharge Instructions (Addendum)
Please keep the wound clean and change dressing everyday. Take Augmentin as prescribed if you developed sign of infection such as fever, redness and discharge from the wound. Return to for the 4th vacccination 7 days from today. Please do not hesitate to return to emergency department if worrisome signs symptoms we discussed become apparent.

## 2022-04-02 NOTE — ED Provider Notes (Signed)
[facility]-EMERGENCY DEPT Provider Note   CSN: [id number] Arrival date & time: 04/02/22  0820     History  Chief Complaint  Patient presents with   Rabies Injection    Brett Moreno is a 26 y.o. male with a past medical history of TBI presents to the emergency department for rabies vaccination.  Patient reports that on October 11 he was bit on his left and right inner thighs with a sustained puncture wound to the left inner thigh.  The patient reports that the dog belongs to an individual who is not close with so he is unable to know the vaccination status.  Denies any redness, discharge from the wound.  Endorses mild pain around the wound.  Mom patient has only taken 2 doses of the Augmentin prescribed on October 11 after he dropped the bottle on the floor.  Denies having any fever.  HPI     Home Medications Prior to Admission medications   Medication Sig Start Date End Date Taking? Authorizing Provider  amoxicillin-clavulanate (AUGMENTIN) 875-125 MG tablet Take 1 tablet by mouth every 12 (twelve) hours. 04/02/22  Yes Jeanelle Malling, PA  cetirizine (ZYRTEC ALLERGY) 10 MG tablet Take 1 tablet (10 mg total) by mouth daily for 14 days. 09/22/21 10/06/21  Cecil Cobbs, PA-C  fluticasone (FLONASE) 50 MCG/ACT nasal spray Place 2 sprays into both nostrils daily. 09/22/21   Cecil Cobbs, PA-C  ibuprofen (ADVIL) 600 MG tablet Take 1 tablet (600 mg total) by mouth every 6 (six) hours as needed. 03/27/22   Roxy Horseman, PA-C  levETIRAcetam (KEPPRA) 500 MG tablet Take 1 tablet (500 mg total) by mouth 2 (two) times daily. 11/06/21 12/06/21  Dione Booze, MD      Allergies    Pork-derived products    Review of Systems   Review of Systems  Skin:  Positive for wound.    Physical Exam Updated Vital Signs BP 126/76 (BP Location: Left Arm)   Pulse 70   Temp 98.2 F (36.8 C) (Oral)   Resp 15   SpO2 100%  Physical Exam Vitals and nursing note reviewed.   Constitutional:      General: He is not in acute distress.    Appearance: Normal appearance. He is well-developed.  HENT:     Head: Normocephalic and atraumatic.  Eyes:     General: No scleral icterus.    Conjunctiva/sclera: Conjunctivae normal.  Cardiovascular:     Rate and Rhythm: Normal rate and regular rhythm.     Heart sounds: No murmur heard. Pulmonary:     Effort: Pulmonary effort is normal. No respiratory distress.     Breath sounds: Normal breath sounds.  Abdominal:     General: Abdomen is flat.     Palpations: Abdomen is soft.     Tenderness: There is no abdominal tenderness.  Musculoskeletal:        General: No swelling or deformity.     Cervical back: Neck supple.  Skin:    General: Skin is warm and dry.     Capillary Refill: Capillary refill takes less than 2 seconds.     Comments: Well-healing puncture wound to the left inner thigh.  No sign of infection.  Neurological:     Mental Status: He is alert.  Psychiatric:        Mood and Affect: Mood normal.     ED Results / Procedures / Treatments   Labs (all labs ordered are listed, but only abnormal results are  displayed) Labs Reviewed - No data to display  EKG None  Radiology No results found.  Procedures Procedures    Medications Ordered in ED Medications  rabies vaccine (RABAVERT) injection 1 mL (1 mL Intramuscular Given 04/02/22 0859)    ED Course/ Medical Decision Making/ A&P                           Medical Decision Making Risk Prescription drug management.   This patient presents to the ED for concern of dog bite wound, this involves an extensive number of treatment options, and is a complaint that carries with it a high risk of complications and morbidity.  The differential diagnosis includes cellulitis, sepsis, skin abrasion, puncture wound. Co morbidities that complicate the patient evaluation  See HPI Additional history obtained:  Additional history obtained from EMR External  records from outside source obtained and reviewed including Care Everywhere/External Records and Primary Care Documents Lab Tests:  na Imaging Studies ordered:  na Cardiac Monitoring: / EKG:  na Consultations Obtained:  na Problem List / ED Course / Critical interventions / Medication management  Dog bite wound Vitals signs within normal range and stable throughout visit Laboratory/imaging studies significant for: See above On physical examination, patient is afebrile and appears in no acute distress.  He does have mild TTP to the wound on the left inner thigh.  There was not no sign infection.  Patient has been changing dressing every day, noticed no drainage from the wound.  Patient has not finished his Augmentin due to dropping the bottle on the floor. I will refill his augmentin and advise him to take it.  Third rabies shot was given here in the ER.  Advised patient to return in 7 days from today for the last rabies vaccination.  Recommended patient to take ibuprofen/Tylenol for pain change dressing every day. Patient's presentations are most concerned for non infected wound. Low suspicion for cellulitis. I ordered medication including rabies shot and Augmentin I have reviewed the patients home medicines and have made adjustments as needed Continued outpatient therapy. Follow-up with PCP recommended for reevaluation of symptoms. Treatment plan discussed with patient.  Pt acknowledged understanding was agreeable to the plan. Social Determinants of Health:  N/A Test / Admission / Dispo - Considered:  Worrisome signs and symptoms were discussed with patient, and patient acknowledged understanding to return to the ED if they noticed these signs and symptoms. Patient was stable upon discharge.          Final Clinical Impression(s) / ED Diagnoses Final diagnoses:  Need for rabies vaccination    Rx / DC Orders ED Discharge Orders          Ordered    amoxicillin-clavulanate  (AUGMENTIN) 875-125 MG tablet  Every 12 hours        04/02/22 0856              Rex Kras, PA 04/02/22 1529    Rancour, Annie Main, MD 04/02/22 1708

## 2022-04-03 ENCOUNTER — Telehealth: Payer: Self-pay | Admitting: Licensed Clinical Social Worker

## 2022-04-04 ENCOUNTER — Telehealth: Payer: Self-pay | Admitting: Licensed Clinical Social Worker

## 2022-04-04 NOTE — Patient Instructions (Signed)
Visit Information  Thank you for taking time to visit with me today. Please don't hesitate to contact me if I can be of assistance to you.   Following are the goals we discussed today:   Goals Addressed             This Visit's Progress    Obtain Supportive Resources-Medical Coverage       Care Coordination Interventions: Solution-Focused Strategies employed:  Active listening / Reflection utilized  Emotional Support Provided Verbalization of feelings encouraged  LCSW informed patient of care coordination services. Pt is interested and provided verbal consent to speak with mother during telephone visits Pt has been awarded [employer] only. States difficulty obtaining medical coverage Pt plans on visiting DSS to obtain new SNAP card LCSW e-mailed CAFA application to pt's mother, per request Family were encouraged to follow up with [facility] to establish with a PCP. Per mother, pt has been diagnosed with a TBI and is interested in establishing with specialists to manage chronic health conditions         Our next appointment is by telephone on 11/03 at 11:30 AM  Please call the care guide team at [phone] if you need to cancel or reschedule your appointment.   If you are experiencing a Mental Health or [place] or need someone to talk to, please call the Suicide and Crisis Lifeline: 988 call 911   Patient verbalizes understanding of instructions and care plan provided today and agrees to view in [place]. Active MyChart status and patient understanding of how to access instructions and care plan via MyChart confirmed with patient.     Christa See, MSW, [email or web address] Phone [phone] 4:24 PM

## 2022-04-04 NOTE — Patient Outreach (Signed)
  Care Coordination   Initial Visit Note   04/04/2022 Name: Brett Moreno MRN: [id number] DOB: Feb 29, 1996  Brett Moreno is a 26 y.o. year old male who sees Patient, No Pcp Per for primary care. I spoke with  Brett Moreno's mother, Brett Moreno, by phone today.  What matters to the patients health and wellness today?  Obtaining medical coverage    Goals Addressed             This Visit's Progress    Obtain Supportive Resources-Medical Coverage   On track    Care Coordination Interventions: Solution-Focused Strategies employed:  Active listening / Reflection utilized  Emotional Support Provided Verbalization of feelings encouraged  LCSW informed patient's mother of care coordination services. Family is interested Pt has been awarded [employer] only. States difficulty obtaining medical coverage LCSW informed pt's mother of the [employer] Assistance Program         SDOH assessments and interventions completed:  No     Care Coordination Interventions Activated:  Yes  Care Coordination Interventions:  Yes, provided   Follow up plan: Follow up call scheduled for 1 week    Encounter Outcome:  Pt. Visit Completed   Christa See, MSW, [name] Phone [phone] 4:13 PM

## 2022-04-04 NOTE — Patient Outreach (Signed)
  Care Coordination   Initial Visit Note   04/04/2022 Name: Brett Moreno MRN: [id number] DOB: 1996-03-13  Brett Moreno is a 26 y.o. year old male who sees Patient, No Pcp Per for primary care. I spoke with  Brett Moreno by phone today.  What matters to the patients health and wellness today?  [employer] Assistance Program    Goals Addressed             This Visit's Progress    Obtain Supportive Resources-Medical Coverage       Care Coordination Interventions: Solution-Focused Strategies employed:  Active listening / Reflection utilized  Emotional Support Provided Verbalization of feelings encouraged  LCSW informed patient of care coordination services. Pt is interested and provided verbal consent to speak with mother during telephone visits Pt has been awarded [employer] only. States difficulty obtaining medical coverage Pt plans on visiting DSS to obtain new SNAP card LCSW e-mailed CAFA application to pt's mother, per request Family were encouraged to follow up with [facility] to establish with a PCP. Per mother, pt has been diagnosed with a TBI and is interested in establishing with specialists to manage chronic health conditions         SDOH assessments and interventions completed:  No     Care Coordination Interventions Activated:  Yes  Care Coordination Interventions:  Yes, provided   Follow up plan: Follow up call scheduled for 2-4 weeks    Encounter Outcome:  Pt. Visit Completed   Christa See, MSW, [name] Phone [phone] 4:22 PM

## 2022-04-04 NOTE — Patient Instructions (Signed)
Visit Information  Thank you for taking time to visit with me today. Please don't hesitate to contact me if I can be of assistance to you.   Following are the goals we discussed today:   Goals Addressed             This Visit's Progress    Obtain Supportive Resources-Medical Coverage   On track    Care Coordination Interventions: Solution-Focused Strategies employed:  Active listening / Reflection utilized  Emotional Support Provided Verbalization of feelings encouraged  LCSW informed patient's mother of care coordination services. Family is interested Pt has been awarded [employer] only. States difficulty obtaining medical coverage LCSW informed pt's mother of the [employer]  Program        If you are experiencing a Mental Health or [place] or need someone to talk to, please call the Suicide and Crisis Lifeline: 988 call 911   Patient verbalizes understanding of instructions and care plan provided today and agrees to view in [place]. Active MyChart status and patient understanding of how to access instructions and care plan via MyChart confirmed with patient.     Christa See, MSW, [email or web address] Phone [phone] 4:13 PM

## 2022-04-09 ENCOUNTER — Other Ambulatory Visit: Payer: Self-pay

## 2022-04-09 ENCOUNTER — Emergency Department (HOSPITAL_COMMUNITY)
Admission: EM | Admit: 2022-04-09 | Discharge: 2022-04-09 | Disposition: A | Discharge status: Home / Self Care | Payer: Medicaid Other | Attending: Emergency Medicine | Admitting: Emergency Medicine

## 2022-04-09 ENCOUNTER — Encounter (HOSPITAL_COMMUNITY): Payer: Self-pay

## 2022-04-09 DIAGNOSIS — Z23 Encounter for immunization: Secondary | ICD-10-CM

## 2022-04-09 DIAGNOSIS — Z203 Contact with and (suspected) exposure to rabies: Secondary | ICD-10-CM | POA: Insufficient documentation

## 2022-04-09 DIAGNOSIS — Z2914 Encounter for prophylactic rabies immune globin: Secondary | ICD-10-CM | POA: Insufficient documentation

## 2022-04-09 MED ORDER — RABIES VACCINE, PCEC IM SUSR
1.0000 mL | Freq: Once | INTRAMUSCULAR | Status: DC
  Filled 2022-04-09: qty 1

## 2022-04-09 NOTE — Discharge Instructions (Signed)
Return if worse, new symptoms, fevers, new/worsening or severe pain, numbness/weakness, or other concern.

## 2022-04-09 NOTE — ED Provider Notes (Signed)
[facility]-EMERGENCY DEPT Provider Note   CSN: [id number] Arrival date & time: 04/09/22  6269     History  Chief Complaint  Patient presents with   Medication Refill    Brett Moreno is a 26 y.o. male.  Pt with hx dog bite to LLE, states is just here for last of rabies vaccine series. Last vaccine ~ 1 week ago. Denies any problem with or reaction to prior vaccines. Indicates would has healed without sign of infection. No fevers. Otherwise asymptomatic.   The history is provided by the patient.  Medication Refill      Home Medications Prior to Admission medications   Medication Sig Start Date End Date Taking? Authorizing Provider  amoxicillin-clavulanate (AUGMENTIN) 875-125 MG tablet Take 1 tablet by mouth every 12 (twelve) hours. 04/02/22   Jeanelle Malling, PA  cetirizine (ZYRTEC ALLERGY) 10 MG tablet Take 1 tablet (10 mg total) by mouth daily for 14 days. 09/22/21 10/06/21  Cecil Cobbs, PA-C  fluticasone (FLONASE) 50 MCG/ACT nasal spray Place 2 sprays into both nostrils daily. 09/22/21   Cecil Cobbs, PA-C  ibuprofen (ADVIL) 600 MG tablet Take 1 tablet (600 mg total) by mouth every 6 (six) hours as needed. 03/27/22   Roxy Horseman, PA-C  levETIRAcetam (KEPPRA) 500 MG tablet Take 1 tablet (500 mg total) by mouth 2 (two) times daily. 11/06/21 12/06/21  Dione Booze, MD      Allergies    Pork-derived products    Review of Systems   Review of Systems  Constitutional:  Negative for fever.  Neurological:  Negative for weakness and numbness.    Physical Exam Updated Vital Signs BP (!) 143/52   Pulse 60   Temp 98.6 F (37 C) (Oral)   Resp 18   Ht 1.829 m (6')   Wt 77.1 kg   SpO2 100%   BMI 23.06 kg/m  Physical Exam Vitals and nursing note reviewed.  Constitutional:      Appearance: Normal appearance. He is well-developed.  HENT:     Head: Atraumatic.     Nose: Nose normal.     Mouth/Throat:     Mouth: Mucous membranes are moist.   Eyes:     General: No scleral icterus.    Conjunctiva/sclera: Conjunctivae normal.  Neck:     Trachea: No tracheal deviation.  Cardiovascular:     Rate and Rhythm: Normal rate.  Pulmonary:     Effort: Pulmonary effort is normal. No accessory muscle usage or respiratory distress.     Breath sounds: Normal breath sounds.  Musculoskeletal:        General: No swelling.     Cervical back: Neck supple.  Skin:    General: Skin is warm and dry.     Findings: No rash.  Neurological:     Mental Status: He is alert.     Comments: Alert, speech clear.   Psychiatric:        Mood and Affect: Mood normal.     ED Results / Procedures / Treatments   Labs (all labs ordered are listed, but only abnormal results are displayed) Labs Reviewed - No data to display  EKG None  Radiology No results found.  Procedures Procedures    Medications Ordered in ED Medications  rabies vaccine (RABAVERT) injection 1 mL (has no administration in time range)    ED Course/ Medical Decision Making/ A&P  Medical Decision Making Risk Prescription drug management.   Pt requesting rabies vaccine.   Reviewed nursing notes and prior charts for additional history.   Rabies vaccine ordered.          Final Clinical Impression(s) / ED Diagnoses Final diagnoses:  None    Rx / DC Orders ED Discharge Orders     None         Lajean Saver, MD 04/09/22 [phone]

## 2022-04-09 NOTE — ED Triage Notes (Signed)
Pt to er, pt states that he is here for his last rabies shot.

## 2022-04-18 ENCOUNTER — Ambulatory Visit: Payer: Self-pay | Admitting: Licensed Clinical Social Worker

## 2022-04-18 NOTE — Patient Instructions (Signed)
Visit Information  Thank you for taking time to visit with me today. Please don't hesitate to contact me if I can be of assistance to you.   Following are the goals we discussed today:   Goals Addressed             This Visit's Progress    Obtain Supportive Resources-Medical Coverage   On track    Care Coordination Interventions: Solution-Focused Strategies employed:  Active listening / Reflection utilized  Emotional Support Provided Verbalization of feelings encouraged  LCSW spoke with Brett Moreno, pt's mother. She states they reviewed CAFA application and concerned about obtaining required documentation. Patient has not filed taxes independently and does not have access to paystubs when he was employed July-September 2023 LCSW informed Ms. Brett Moreno that open enrollment for [facility] Medicaid begins December 1, 23. LCSW strongly encouraged her to apply for patient, who may be eligible. Family can apply for CAFA, in the case pt is denied medicaid coverage  LCSW verified [employer] LCSW reach out to [facility] regarding Development worker, community availability, for assistance with CAFA application Per mother, pt's wound has healed properly and has no indication of an infection. Pt has a hx of seizures and family continue to monitor him          Our next appointment is by telephone on 05/16/22 at 3:00 PM  Please call the care guide team at [phone] if you need to cancel or reschedule your appointment.   If you are experiencing a Mental Health or [place] or need someone to talk to, please call the Suicide and Crisis Lifeline: 988 call 911   Patient verbalizes understanding of instructions and care plan provided today and agrees to view in [place]. Active MyChart status and patient understanding of how to access instructions and care plan via MyChart confirmed with patient.     Brett Moreno, MSW, [email or web address] Phone [phone] 4:29 PM

## 2022-04-18 NOTE — Patient Outreach (Signed)
  Care Coordination   Follow Up Visit Note   04/18/2022 Name: Brett Moreno MRN: [id number] DOB: [birth year]  Brett Moreno is a 26 y.o. year old male who sees Patient, No Pcp Per for primary care. I  spoke with pt's mother, Brett Moreno  What matters to the patients health and wellness today?  Obtaining medical coverage    Goals Addressed             This Visit's Progress    Obtain Supportive Resources-Medical Coverage   On track    Care Coordination Interventions: Solution-Focused Strategies employed:  Active listening / Reflection utilized  Emotional Support Provided Verbalization of feelings encouraged  LCSW spoke with Brett Moreno, pt's mother. She states they reviewed CAFA application and concerned about obtaining required documentation. Patient has not filed taxes independently and does not have access to paystubs when he was employed July-September 2023 LCSW informed Ms. Brett Moreno that open enrollment for [facility] Medicaid begins December 1, 23. LCSW strongly encouraged her to apply for patient, who may be eligible. Family can apply for CAFA, in the case pt is denied medicaid coverage  LCSW verified [employer] LCSW reach out to [facility] regarding Development worker, community availability, for assistance with CAFA application Per mother, pt's wound has healed properly and has no indication of an infection. Pt has a hx of seizures and family continue to monitor him          SDOH assessments and interventions completed:  No     Care Coordination Interventions Activated:  Yes  Care Coordination Interventions:  Yes, provided   Follow up plan: Follow up call scheduled for 4-6 weeks    Encounter Outcome:  Pt. Visit Completed   Christa See, MSW, [email or web address] Phone [phone] 4:28 PM

## 2022-05-16 ENCOUNTER — Ambulatory Visit: Payer: Medicaid Other | Admitting: Licensed Clinical Social Worker

## 2022-05-19 NOTE — Patient Instructions (Signed)
Visit Information  Thank you for taking time to visit with me today. Please don't hesitate to contact me if I can be of assistance to you.   Following are the goals we discussed today:   Goals Addressed             This Visit's Progress    Obtain Supportive Resources-Medical Coverage   On track    Care Coordination Interventions: Solution-Focused Strategies employed:  Active listening / Reflection utilized  Emotional Support Provided Verbalization of feelings encouraged  LCSW spoke with Dondra Spry, pt's mother. She states they reviewed CAFA application and concerned about obtaining required documentation. Patient has not filed taxes independently and does not have access to paystubs when he was employed July-September 2023 LCSW informed Ms. Dondra Spry that open enrollment for [facility] Medicaid begins December 1, 23. LCSW strongly encouraged her to apply for patient, who may be eligible. Family can apply for CAFA, in the case pt is denied medicaid coverage  LCSW verified [employer] LCSW reach out to [facility] regarding Artist availability, for assistance with CAFA application Per mother, pt's wound has healed properly and has no indication of an infection. Pt has a hx of seizures and family continue to monitor him          Please call the care guide team at [phone] if you need to cancel or reschedule your appointment.   If you are experiencing a Mental Health or Behavioral Health Crisis or need someone to talk to, please call the Suicide and Crisis Lifeline: 988 call 911   Patient verbalizes understanding of instructions and care plan provided today and agrees to view in MyChart. Active MyChart status and patient understanding of how to access instructions and care plan via MyChart confirmed with patient.     No further follow up required: Pt's mother agreed to f/up with LCSW, if additional needs arise  Jenel Lucks, MSW, LCSW [facility] Care Management [facility]  [facility] [email or web address] Phone [phone] 10:52 PM

## 2022-05-19 NOTE — Patient Outreach (Addendum)
  Care Coordination   Follow Up Visit Note   05/19/2022 Name: Brett Moreno MRN: [id number] DOB: Feb 14, 1996  Brett Moreno is a 26 y.o. year old male who sees Patient, No Pcp Per for primary care. I spoke with  Gerarda Fraction Hable's mother, Dondra Spry, by phone today.  What matters to the patients health and wellness today?  Medicaid Expansion    Goals Addressed             This Visit's Progress    Obtain Supportive Resources-Medical Coverage   On track    Care Coordination Interventions: Solution-Focused Strategies employed:  Active listening / Reflection utilized  Emotional Support Provided Verbalization of feelings encouraged  LCSW spoke with Dondra Spry, pt's mother. She states they reviewed CAFA application and concerned about obtaining required documentation. Patient has not filed taxes independently and does not have access to paystubs when he was employed July-September 2023 LCSW informed Ms. Dondra Spry that open enrollment for [facility] Medicaid begins December 1, 23. LCSW strongly encouraged her to apply for patient, who may be eligible. Family can apply for CAFA, in the case pt is denied medicaid coverage  LCSW verified [employer] LCSW reach out to [facility] regarding Artist availability, for assistance with CAFA application Per mother, pt's wound has healed properly and has no indication of an infection. Pt has a hx of seizures and family continue to monitor him          SDOH assessments and interventions completed:  Yes  SDOH Interventions Today    Flowsheet Row Most Recent Value  SDOH Interventions   Housing Interventions Intervention Not Indicated  [Pt residing with family]  Transportation Interventions Intervention Not Indicated        Care Coordination Interventions:  Yes, provided   Follow up plan: No further intervention required.   Encounter Outcome:  Pt. Visit Completed   Jenel Lucks, MSW, LCSW [facility] Care Management [facility]  [facility] [email or web address] Phone [phone] 10:51 PM

## 2022-09-16 ENCOUNTER — Other Ambulatory Visit: Payer: Self-pay

## 2022-09-16 ENCOUNTER — Encounter (HOSPITAL_COMMUNITY): Payer: Self-pay | Admitting: Emergency Medicine

## 2022-09-16 ENCOUNTER — Emergency Department (HOSPITAL_COMMUNITY)
Admission: EM | Admit: 2022-09-16 | Discharge: 2022-09-16 | Disposition: A | Discharge status: Home / Self Care | Payer: 59 | Attending: Emergency Medicine | Admitting: Emergency Medicine

## 2022-09-16 DIAGNOSIS — Z711 Person with feared health complaint in whom no diagnosis is made: Secondary | ICD-10-CM | POA: Insufficient documentation

## 2022-09-16 LAB — GC/CHLAMYDIA PROBE AMP (~~LOC~~) NOT AT ARMC
Chlamydia: NEGATIVE
Comment: NEGATIVE
Comment: NORMAL
Neisseria Gonorrhea: NEGATIVE

## 2022-09-16 LAB — HIV ANTIBODY (ROUTINE TESTING W REFLEX): HIV Screen 4th Generation wRfx: NONREACTIVE

## 2022-09-16 NOTE — ED Notes (Signed)
Pt states unable to provide urine sample at this time.

## 2022-09-16 NOTE — ED Triage Notes (Signed)
Pt requesting STI testing. Denies any symptoms at this time, just possible exposure

## 2022-09-16 NOTE — ED Provider Notes (Signed)
[facility] Provider Note  CSN: [id number] Arrival date & time: 09/16/22 [id number]  Chief Complaint(s) Exposure to STD  HPI Brett Moreno is a 27 y.o. male who presents to the emergency department requesting STD check.  He reports that he has a new girlfriend who is here being evaluated for back pain and possible urinary tract infection.  States that they have had unprotected intercourse recently.  He denies any dysuria or discharge.  He does report prior history of chlamydia in the past.  No other physical complaints  The history is provided by the patient.    Past Medical History Past Medical History:  Diagnosis Date   TBI (traumatic brain injury)    Patient Active Problem List   Diagnosis Date Noted   SDH (subdural hematoma) 10/07/2020   Home Medication(s) Prior to Admission medications   Medication Sig Start Date End Date Taking? Authorizing Provider  amoxicillin-clavulanate (AUGMENTIN) 875-125 MG tablet Take 1 tablet by mouth every 12 (twelve) hours. 04/02/22   Rex Kras, PA  cetirizine (ZYRTEC ALLERGY) 10 MG tablet Take 1 tablet (10 mg total) by mouth daily for 14 days. 09/22/21 [id number]  Prince Rome, PA-C  fluticasone (FLONASE) 50 MCG/ACT nasal spray Place 2 sprays into both nostrils daily. [id number]   Prince Rome, PA-C  ibuprofen (ADVIL) 600 MG tablet Take 1 tablet (600 mg total) by mouth every 6 (six) hours as needed. 03/27/22   Montine Circle, PA-C  levETIRAcetam (KEPPRA) 500 MG tablet Take 1 tablet (500 mg total) by mouth 2 (two) times daily. [id number]  Delora Fuel, MD                                                                                                                                    Allergies Pork-derived products  Review of Systems Review of Systems As noted in HPI  Physical Exam Vital Signs  I have reviewed the triage vital signs BP 120/74   Pulse 99   Temp 98.3 F (36.8 C) (Oral)    Resp 16   Ht 6' (1.829 m)   Wt 79.8 kg   SpO2 99%   BMI 23.87 kg/m   Physical Exam Vitals reviewed.  Constitutional:      General: He is not in acute distress.    Appearance: He is well-developed. He is not diaphoretic.  HENT:     Head: Normocephalic and atraumatic.     Right Ear: External ear normal.     Left Ear: External ear normal.     Nose: Nose normal.     Mouth/Throat:     Mouth: Mucous membranes are moist.  Eyes:     General: No scleral icterus.    Conjunctiva/sclera: Conjunctivae normal.  Neck:     Trachea: Phonation normal.  Cardiovascular:     Rate and Rhythm: Normal rate and regular rhythm.  Pulmonary:  Effort: Pulmonary effort is normal. No respiratory distress.     Breath sounds: No stridor.  Abdominal:     General: There is no distension.  Musculoskeletal:        General: Normal range of motion.     Cervical back: Normal range of motion.  Neurological:     Mental Status: He is alert and oriented to person, place, and time.  Psychiatric:        Behavior: Behavior normal.     ED Results and Treatments Labs (all labs ordered are listed, but only abnormal results are displayed) Labs Reviewed  HIV ANTIBODY (ROUTINE TESTING W REFLEX)  GC/CHLAMYDIA PROBE AMP ([place]) NOT AT [facility]                                                                                                                         EKG  EKG Interpretation  Date/Time:    Ventricular Rate:    PR Interval:    QRS Duration:   QT Interval:    QTC Calculation:   R Axis:     Text Interpretation:         Radiology No results found.  Medications Ordered in ED Medications - No data to display                                                                                                                                   Procedures Procedures  (including critical care time)  Medical Decision Making / ED Course  Click here for ABCD2, HEART and other calculators  Medical  Decision Making   Asymptomatic GC/chlamydia UA sent HIV antibody sent. No need for empiric treatment at this time Informed that he will be contacted if positive results, but recommended he follow-up on MyChart for results.      Final Clinical Impression(s) / ED Diagnoses Final diagnoses:  Concern about STD in male without diagnosis  The patient appears reasonably screened and/or stabilized for discharge and I doubt any other medical condition or other [facility] requiring further screening, evaluation, or treatment in the ED at this time. I have discussed the findings, Dx and Tx plan with the patient/family who expressed understanding and agree(s) with the plan. Discharge instructions discussed at length. The patient/family was given strict return precautions who verbalized understanding of the instructions. No further questions at time of discharge.  Disposition: Discharge  Condition: Good  ED  Discharge Orders     None                This chart was dictated using voice recognition software.  Despite best efforts to proofread,  errors can occur which can change the documentation meaning.    Fatima Blank, MD 09/16/22 [phone]

## 2023-02-20 NOTE — Progress Notes (Signed)
This encounter was created in error - please disregard.
